# Patient Record
Sex: Female | Born: 1940 | Race: Black or African American | Hispanic: Yes | Marital: Single | Smoking: Never smoker
Health system: Southern US, Community
[De-identification: ages and names within clinical notes are randomized; demographics above are authoritative.]

## PROBLEM LIST (undated history)

## (undated) DIAGNOSIS — J302 Other seasonal allergic rhinitis: Secondary | ICD-10-CM

## (undated) DIAGNOSIS — I10 Essential (primary) hypertension: Secondary | ICD-10-CM

## (undated) DIAGNOSIS — K219 Gastro-esophageal reflux disease without esophagitis: Secondary | ICD-10-CM

## (undated) DIAGNOSIS — E785 Hyperlipidemia, unspecified: Secondary | ICD-10-CM

## (undated) HISTORY — PX: HYSTERECTOMY: SHX81

## (undated) HISTORY — PX: OTHER SURGICAL HISTORY: SHX169

## (undated) HISTORY — PX: DILATION AND CURETTAGE, DIAGNOSTIC / THERAPEUTIC: SUR384

## (undated) HISTORY — DX: Other seasonal allergic rhinitis: J30.2

## (undated) HISTORY — DX: Hyperlipidemia, unspecified: E78.5

## (undated) HISTORY — DX: Essential (primary) hypertension: I10

## (undated) HISTORY — DX: Gastro-esophageal reflux disease without esophagitis: K21.9

---

## 1986-03-12 ENCOUNTER — Inpatient Hospital Stay
Admission: RE | Admit: 1986-03-12 | Disposition: A | Discharge status: Home / Self Care | Payer: Self-pay | Source: Ambulatory Visit

## 1986-03-22 ENCOUNTER — Inpatient Hospital Stay
Admission: EM | Admit: 1986-03-22 | Disposition: A | Discharge status: Home / Self Care | Payer: Self-pay | Source: Ambulatory Visit

## 2002-01-27 ENCOUNTER — Ambulatory Visit
Admission: RE | Admit: 2002-01-27 | Disposition: A | Discharge status: Home / Self Care | Payer: Self-pay | Source: Ambulatory Visit

## 2003-09-27 ENCOUNTER — Ambulatory Visit
Admission: RE | Admit: 2003-09-27 | Disposition: A | Discharge status: Home / Self Care | Payer: Self-pay | Source: Ambulatory Visit

## 2009-06-01 DIAGNOSIS — J309 Allergic rhinitis, unspecified: Secondary | ICD-10-CM | POA: Insufficient documentation

## 2009-06-01 DIAGNOSIS — K219 Gastro-esophageal reflux disease without esophagitis: Secondary | ICD-10-CM | POA: Insufficient documentation

## 2015-10-25 ENCOUNTER — Other Ambulatory Visit
Admission: RE | Admit: 2015-10-25 | Discharge: 2015-10-25 | Disposition: A | Discharge status: Home / Self Care | Payer: Medicare Other | Source: Ambulatory Visit | Attending: Internal Medicine | Admitting: Internal Medicine

## 2015-10-25 LAB — CBC AND DIFFERENTIAL
Basophils %: 0.8 % (ref 0.0–3.0)
Basophils Absolute: 0 10*3/uL (ref 0.0–0.3)
Eosinophils %: 2.5 % (ref 0.0–7.0)
Eosinophils Absolute: 0.1 10*3/uL (ref 0.0–0.8)
Hematocrit: 37.3 % (ref 36.0–48.0)
Hemoglobin: 12.3 gm/dL (ref 12.0–16.0)
Lymphocytes Absolute: 1.6 10*3/uL (ref 0.6–5.1)
Lymphocytes: 27.3 % (ref 15.0–46.0)
MCH: 33 pg (ref 28–35)
MCHC: 33 gm/dL (ref 32–36)
MCV: 99 fL (ref 80–100)
MPV: 8.5 fL (ref 6.0–10.0)
Monocytes Absolute: 0.4 10*3/uL (ref 0.1–1.7)
Monocytes: 6.3 % (ref 3.0–15.0)
Neutrophils %: 63.1 % (ref 42.0–78.0)
Neutrophils Absolute: 3.7 10*3/uL (ref 1.7–8.6)
PLT CT: 221 10*3/uL (ref 130–440)
RBC: 3.78 10*6/uL — ABNORMAL LOW (ref 3.80–5.00)
RDW: 12.8 % (ref 11.0–14.0)
WBC: 5.8 10*3/uL (ref 4.0–11.0)

## 2015-10-25 LAB — VH URINALYSIS WITH MICROSCOPIC
Bilirubin, UA: NEGATIVE
Blood, UA: NEGATIVE
Glucose, UA: NEGATIVE mg/dL
Ketones UA: NEGATIVE mg/dL
Leukocyte Esterase, UA: NEGATIVE Leu/uL
Nitrite, UA: NEGATIVE
Protein, UR: NEGATIVE mg/dL
RBC, UA: <1 /hpf (ref 0–5)
Squam Epithel, UA: 29 /hpf — ABNORMAL HIGH (ref 0–2)
Urine Specific Gravity: 1.021 (ref 1.001–1.040)
Urobilinogen, UA: NORMAL mg/dL
WBC, UA: 1 /hpf (ref 0–4)
pH, Urine: 5 pH (ref 5.0–8.0)

## 2015-10-25 LAB — VITAMIN D,25 OH,TOTAL: Vitamin D 25-Hydroxy: 29 ng/mL — ABNORMAL LOW (ref 30–80)

## 2015-10-25 LAB — CREATININE, URINE, RANDOM: Urine Creatinine Random: 172.93 mg/dL

## 2015-10-25 LAB — FERRITIN: Ferritin: 62.63 ng/mL (ref 4.60–204.00)

## 2015-10-25 LAB — PROTEIN, URINE, RANDOM: Urine Protein: 9.6 mg/dL (ref 0.0–14.0)

## 2015-10-25 LAB — IRON PROFILE
% Saturation: 24 % (ref 15–50)
Iron: 67.8 ug/dL (ref 40.0–160.0)
TIBC: 288 ug/dL (ref 250–450)
Transferrin: 206 mg/dL (ref 173.0–360.0)

## 2015-10-25 LAB — RENAL FUNCTION PANEL
Albumin: 3.8 gm/dL (ref 3.5–5.0)
Anion Gap: 11.9 mMol/L (ref 7.0–18.0)
BUN / Creatinine Ratio: 18.4 Ratio (ref 10.0–30.0)
BUN: 26 mg/dL — ABNORMAL HIGH (ref 7–22)
CO2: 27.6 mMol/L (ref 20.0–30.0)
Calcium: 9.4 mg/dL (ref 8.5–10.5)
Chloride: 107 mMol/L (ref 98–110)
Creatinine: 1.41 mg/dL — ABNORMAL HIGH (ref 0.60–1.20)
EGFR: 44 mL/min/{1.73_m2}
Glucose: 125 mg/dL — ABNORMAL HIGH (ref 70–99)
Osmolality Calc: 291 mOsm/kg (ref 275–300)
Phosphorus: 3.6 mg/dL (ref 2.3–4.7)
Potassium: 3.5 mMol/L (ref 3.5–5.3)
Sodium: 143 mMol/L (ref 136–147)

## 2015-10-25 LAB — PTH, INTACT: PTH Intact: 74.1 pg/mL (ref 20.0–83.0)

## 2015-10-25 LAB — PHOSPHORUS: Phosphorus: 3.6 mg/dL (ref 2.3–4.7)

## 2015-10-25 LAB — URIC ACID: Uric acid: 6 mg/dL (ref 2.6–6.0)

## 2015-11-07 ENCOUNTER — Other Ambulatory Visit
Admission: RE | Admit: 2015-11-07 | Discharge: 2015-11-07 | Disposition: A | Discharge status: Home / Self Care | Payer: Medicare Other | Source: Ambulatory Visit | Attending: Family Medicine | Admitting: Family Medicine

## 2015-11-07 LAB — COMPREHENSIVE METABOLIC PANEL
ALT: 19 U/L (ref 0–55)
AST (SGOT): 19 U/L (ref 10–42)
Albumin/Globulin Ratio: 1.25 Ratio (ref 0.70–1.50)
Albumin: 4 gm/dL (ref 3.5–5.0)
Alkaline Phosphatase: 106 U/L (ref 40–145)
Anion Gap: 17.5 mMol/L (ref 7.0–18.0)
BUN / Creatinine Ratio: 17.9 Ratio (ref 10.0–30.0)
BUN: 26 mg/dL — ABNORMAL HIGH (ref 7–22)
Bilirubin, Total: 0.4 mg/dL (ref 0.1–1.2)
CO2: 26.6 mMol/L (ref 20.0–30.0)
Calcium: 10.1 mg/dL (ref 8.5–10.5)
Chloride: 103 mMol/L (ref 98–110)
Creatinine: 1.45 mg/dL — ABNORMAL HIGH (ref 0.60–1.20)
EGFR: 43 mL/min/{1.73_m2}
Globulin: 3.2 gm/dL (ref 2.0–4.0)
Glucose: 109 mg/dL — ABNORMAL HIGH (ref 70–99)
Osmolality Calc: 290 mOsm/kg (ref 275–300)
Potassium: 4.1 mMol/L (ref 3.5–5.3)
Protein, Total: 7.2 gm/dL (ref 6.0–8.3)
Sodium: 143 mMol/L (ref 136–147)

## 2015-11-07 LAB — LIPID PANEL
Cholesterol: 230 mg/dL — ABNORMAL HIGH (ref 75–199)
Coronary Heart Disease Risk: 2.84
HDL: 81 mg/dL — ABNORMAL HIGH (ref 45–65)
LDL Calculated: 130 mg/dL
Triglycerides: 95 mg/dL (ref 10–150)
VLDL: 19 (ref 0–40)

## 2016-04-07 ENCOUNTER — Other Ambulatory Visit (INDEPENDENT_AMBULATORY_CARE_PROVIDER_SITE_OTHER): Payer: Self-pay | Admitting: Family Medicine

## 2016-04-24 ENCOUNTER — Other Ambulatory Visit
Admission: RE | Admit: 2016-04-24 | Discharge: 2016-04-24 | Disposition: A | Discharge status: Home / Self Care | Payer: Medicare Other | Source: Ambulatory Visit | Attending: Family Medicine | Admitting: Family Medicine

## 2016-04-24 ENCOUNTER — Ambulatory Visit (INDEPENDENT_AMBULATORY_CARE_PROVIDER_SITE_OTHER): Payer: Medicare Other

## 2016-04-24 DIAGNOSIS — I10 Essential (primary) hypertension: Secondary | ICD-10-CM

## 2016-04-24 DIAGNOSIS — E7800 Pure hypercholesterolemia, unspecified: Secondary | ICD-10-CM

## 2016-04-24 LAB — LIPID PANEL
Cholesterol: 195 mg/dL (ref 75–199)
Coronary Heart Disease Risk: 2.87
HDL: 68 mg/dL — ABNORMAL HIGH (ref 45–65)
LDL Calculated: 113 mg/dL
Triglycerides: 69 mg/dL (ref 10–150)
VLDL: 14 (ref 0–40)

## 2016-04-24 LAB — CBC AND DIFFERENTIAL
Basophils %: 0.2 % (ref 0.0–3.0)
Basophils Absolute: 0 10*3/uL (ref 0.0–0.3)
Eosinophils %: 3.2 % (ref 0.0–7.0)
Eosinophils Absolute: 0.2 10*3/uL (ref 0.0–0.8)
Hematocrit: 37.4 % (ref 36.0–48.0)
Hemoglobin: 11.9 gm/dL — ABNORMAL LOW (ref 12.0–16.0)
Lymphocytes Absolute: 1.5 10*3/uL (ref 0.6–5.1)
Lymphocytes: 28.1 % (ref 15.0–46.0)
MCH: 31 pg (ref 28–35)
MCHC: 32 gm/dL (ref 32–36)
MCV: 98 fL (ref 80–100)
MPV: 8.5 fL (ref 6.0–10.0)
Monocytes Absolute: 0.4 10*3/uL (ref 0.1–1.7)
Monocytes: 7.1 % (ref 3.0–15.0)
Neutrophils %: 61.4 % (ref 42.0–78.0)
Neutrophils Absolute: 3.3 10*3/uL (ref 1.7–8.6)
PLT CT: 202 10*3/uL (ref 130–440)
RBC: 3.8 10*6/uL (ref 3.80–5.00)
RDW: 13.1 % (ref 11.0–14.0)
WBC: 5.4 10*3/uL (ref 4.0–11.0)

## 2016-04-24 LAB — COMPREHENSIVE METABOLIC PANEL
ALT: 19 U/L (ref 0–55)
AST (SGOT): 21 U/L (ref 10–42)
Albumin/Globulin Ratio: 1.12 Ratio (ref 0.70–1.50)
Albumin: 3.8 gm/dL (ref 3.5–5.0)
Alkaline Phosphatase: 104 U/L (ref 40–145)
Anion Gap: 13.4 mMol/L (ref 7.0–18.0)
BUN / Creatinine Ratio: 20.3 Ratio (ref 10.0–30.0)
BUN: 26 mg/dL — ABNORMAL HIGH (ref 7–22)
Bilirubin, Total: <0.3 mg/dL (ref 0.1–1.2)
CO2: 28.3 mMol/L (ref 20.0–30.0)
Calcium: 9.9 mg/dL (ref 8.5–10.5)
Chloride: 106 mMol/L (ref 98–110)
Creatinine: 1.28 mg/dL — ABNORMAL HIGH (ref 0.60–1.20)
EGFR: 47 mL/min/{1.73_m2} — ABNORMAL LOW (ref 60–150)
Globulin: 3.4 gm/dL (ref 2.0–4.0)
Glucose: 90 mg/dL (ref 70–99)
Osmolality Calc: 291 mOsm/kg (ref 275–300)
Potassium: 3.7 mMol/L (ref 3.5–5.3)
Protein, Total: 7.2 gm/dL (ref 6.0–8.3)
Sodium: 144 mMol/L (ref 136–147)

## 2016-04-24 NOTE — Progress Notes (Signed)
Date Specimen Drawn:  04/24/2016   Time Specimen Drawn:  10:07 AM   Test(s) Ordered:  CBC, CMP, LIPID   Disposition:  n/a   Patient's Tolerance:  Good   Location Specimen Drawn:  Right antecubital

## 2016-05-03 ENCOUNTER — Ambulatory Visit (INDEPENDENT_AMBULATORY_CARE_PROVIDER_SITE_OTHER): Payer: Medicare Other | Admitting: Family Medicine

## 2016-05-03 VITALS — BP 120/70 | HR 82 | Temp 98.6°F | Resp 16 | Ht 67.0 in | Wt 180.0 lb

## 2016-05-03 DIAGNOSIS — E7800 Pure hypercholesterolemia, unspecified: Secondary | ICD-10-CM

## 2016-05-03 DIAGNOSIS — I10 Essential (primary) hypertension: Secondary | ICD-10-CM | POA: Insufficient documentation

## 2016-05-03 MED ORDER — LOSARTAN POTASSIUM 100 MG PO TABS
100.0000 mg | ORAL_TABLET | Freq: Every day | ORAL | Status: DC
Start: 2016-05-03 — End: 2016-11-02

## 2016-05-03 NOTE — Procedures (Signed)
Date Time: 05/03/2016 12:29 PM  Patient Name: Kelli Jordan, Kelli Jordan  Primary Care Physician: Alfonse Alpers, MD      History of Presenting Illness:   Kelli Jordan is here today for Blood work results and Cholesterol  Check.  Kelli Jordan cannot recall when the diagnosis was first made. The patient does not have a diet or regular exercise regimen. The patient has been compliant with medication intake. Kelli Jordan denies any hyper cholesterol related symptoms or side effects from medication. Recent blood work shows TC=195, LDL=113, HDL=68, Trig=69.    With regards to her HTN, she is doing fairly well, and denies any side effects from her medication.      Review of Systems   Constitutional: Positive for malaise/fatigue.   All other systems reviewed and are negative.         Past Medical History:   No past medical history on file.    Past Surgical History:   No past surgical history on file.    Family History:   No family history on file.    Social History:     Social History     Social History   . Marital Status: Single     Spouse Name: N/A   . Number of Children: N/A   . Years of Education: N/A     Occupational History   . Not on file.     Social History Main Topics   . Smoking status: Not on file   . Smokeless tobacco: Not on file   . Alcohol Use: Not on file   . Drug Use: Not on file   . Sexual Activity: Not on file     Other Topics Concern   . Not on file     Social History Narrative   . No narrative on file     History   Smoking status   . Not on file   Smokeless tobacco   . Not on file     History   Alcohol Use: Not on file     History   Drug Use Not on file       Allergies:     Allergies   Allergen Reactions   . Aspirin Rash     Rash with higher doses, denies symptoms with Aspirin 81 mg, managed by PCP per patient   . Iodine Rash     Contrast iodine only       Medications:     Prior to Admission medications    Medication Sig Start Date End Date Taking? Authorizing Provider   amLODIPine (NORVASC) 10 MG  tablet TAKE 1 TAB BY MOUTH DAILY 04/11/16  Yes Nyzier Boivin, Clotilde Dieter, MD   aspirin 81 MG tablet    Yes [provider]   atorvastatin (LIPITOR) 40 MG tablet    Yes [provider]   Diclofenac Sodium 1 % Cream Place 1 % onto the skin 2 (two) times daily as needed.   Yes [provider]   fluticasone Aleda Grana) 50 MCG/ACT nasal spray  07/12/15  Yes [provider]   labetalol (NORMODYNE) 200 MG tablet TAKE 1 TAB BY MOUTH TWICE DAILY 03/09/16  Yes [provider]   loratadine (CLARITIN) 10 MG tablet    Yes [provider]   losartan (COZAAR) 100 MG tablet    Yes [provider]   Prenatal Vit-Fe Fumarate-FA (PRENATAL VITAMIN) 27-0.8 MG Tab    Yes [provider]   raNITIdine (  ZANTAC) 300 MG tablet TAKE 1 TABLET BY MOUTH IN THE EVENING ANY FURTHER REFILL REQUESTS CAN BE DIRECTED TO PCP 03/12/16  Yes [provider]   Multiple Minerals-Vitamins (CALCIUM & VIT D3 BONE HEALTH PO)   05/03/16 Yes [provider]       Filed Vitals:    05/03/16 1157   BP: 120/70   Pulse: 82   Temp: 98.6 F (37 C)   Resp: 16       Physical Exam   Constitutional: She appears well-developed and well-nourished. No distress.   Obese   HENT:   Head: Normocephalic and atraumatic.   Right Ear: External ear normal.   Left Ear: External ear normal.   Nose: Nose normal.   Mouth/Throat: Oropharynx is clear and moist.   BIlateral TMs intact.   Eyes: Conjunctivae and EOM are normal. Pupils are equal, round, and reactive to light. Right eye exhibits no discharge. Left eye exhibits no discharge. No scleral icterus.   Neck: Normal range of motion. Neck supple. No JVD present. No tracheal deviation present. No thyromegaly present.   Bilateral carotids, no bruits   Cardiovascular: Normal rate, regular rhythm, normal heart sounds and intact distal pulses.    No murmur heard.  PMI normal.   Pulmonary/Chest: Effort normal and breath sounds normal. No respiratory distress. She has no  wheezes. She has no rales. She exhibits no tenderness.   Abdominal: Soft. Bowel sounds are normal. She exhibits no distension and no mass. There is no tenderness. There is no guarding.   Lymphadenopathy:     She has no cervical adenopathy.   Skin: She is not diaphoretic.   Vitals reviewed.            Assessment:     1. Essential hypertension     2. Hypercholesteremia            Plan:      No change in medication.   Counseling with regards to low salt, low fat diet; increase physical activity with a goal to lose weight.           Signed by: Alfonse Alpers, MD

## 2016-07-14 ENCOUNTER — Other Ambulatory Visit (INDEPENDENT_AMBULATORY_CARE_PROVIDER_SITE_OTHER): Payer: Self-pay | Admitting: Family Medicine

## 2016-07-27 ENCOUNTER — Ambulatory Visit (INDEPENDENT_AMBULATORY_CARE_PROVIDER_SITE_OTHER): Payer: Medicare Other

## 2016-07-27 ENCOUNTER — Other Ambulatory Visit (INDEPENDENT_AMBULATORY_CARE_PROVIDER_SITE_OTHER): Payer: Self-pay | Admitting: Family Medicine

## 2016-07-27 DIAGNOSIS — Z23 Encounter for immunization: Secondary | ICD-10-CM

## 2016-07-30 ENCOUNTER — Other Ambulatory Visit (INDEPENDENT_AMBULATORY_CARE_PROVIDER_SITE_OTHER): Payer: Self-pay | Admitting: Family Medicine

## 2016-08-24 ENCOUNTER — Encounter (INDEPENDENT_AMBULATORY_CARE_PROVIDER_SITE_OTHER): Payer: Self-pay

## 2016-08-24 NOTE — Progress Notes (Signed)
Kelli Jordan comes in to have BP checked. BP=138/80. Dr Junius Roads informed. No action needed.  pmartin lpn

## 2016-09-29 ENCOUNTER — Other Ambulatory Visit (INDEPENDENT_AMBULATORY_CARE_PROVIDER_SITE_OTHER): Payer: Self-pay | Admitting: Family Medicine

## 2016-10-14 ENCOUNTER — Other Ambulatory Visit (INDEPENDENT_AMBULATORY_CARE_PROVIDER_SITE_OTHER): Payer: Self-pay | Admitting: Family Medicine

## 2016-10-29 ENCOUNTER — Ambulatory Visit (INDEPENDENT_AMBULATORY_CARE_PROVIDER_SITE_OTHER): Payer: Medicare Other

## 2016-10-29 ENCOUNTER — Other Ambulatory Visit
Admission: RE | Admit: 2016-10-29 | Discharge: 2016-10-29 | Disposition: A | Discharge status: Home / Self Care | Payer: Medicare Other | Source: Ambulatory Visit | Attending: Family Medicine | Admitting: Family Medicine

## 2016-10-29 ENCOUNTER — Other Ambulatory Visit (INDEPENDENT_AMBULATORY_CARE_PROVIDER_SITE_OTHER): Payer: Self-pay | Admitting: Family Medicine

## 2016-10-29 DIAGNOSIS — I10 Essential (primary) hypertension: Secondary | ICD-10-CM

## 2016-10-29 DIAGNOSIS — E7800 Pure hypercholesterolemia, unspecified: Secondary | ICD-10-CM

## 2016-10-29 DIAGNOSIS — Z79899 Other long term (current) drug therapy: Secondary | ICD-10-CM

## 2016-10-29 LAB — COMPREHENSIVE METABOLIC PANEL
ALT: 17 U/L (ref 0–55)
AST (SGOT): 17 U/L (ref 10–42)
Albumin/Globulin Ratio: 1.12 Ratio (ref 0.70–1.50)
Albumin: 3.7 gm/dL (ref 3.5–5.0)
Alkaline Phosphatase: 120 U/L (ref 40–145)
Anion Gap: 13.8 mMol/L (ref 7.0–18.0)
BUN / Creatinine Ratio: 15.8 Ratio (ref 10.0–30.0)
BUN: 15 mg/dL (ref 7–22)
Bilirubin, Total: 0.4 mg/dL (ref 0.1–1.2)
CO2: 24.2 mMol/L (ref 20.0–30.0)
Calcium: 9.6 mg/dL (ref 8.5–10.5)
Chloride: 109 mMol/L (ref 98–110)
Creatinine: 0.95 mg/dL (ref 0.60–1.20)
EGFR: 68 mL/min/{1.73_m2} (ref 60–150)
Globulin: 3.3 gm/dL (ref 2.0–4.0)
Glucose: 103 mg/dL — ABNORMAL HIGH (ref 70–99)
Osmolality Calc: 286 mOsm/kg (ref 275–300)
Potassium: 4 mMol/L (ref 3.5–5.3)
Protein, Total: 7 gm/dL (ref 6.0–8.3)
Sodium: 143 mMol/L (ref 136–147)

## 2016-10-29 LAB — LIPID PANEL
Cholesterol: 182 mg/dL (ref 75–199)
Coronary Heart Disease Risk: 2.53
HDL: 72 mg/dL — ABNORMAL HIGH (ref 45–65)
LDL Calculated: 95 mg/dL
Triglycerides: 76 mg/dL (ref 10–150)
VLDL: 15 (ref 0–40)

## 2016-10-29 NOTE — Progress Notes (Signed)
Date Specimen Drawn:  10/29/2016   Time Specimen Drawn:  9:19 AM   Test(s) Ordered:  CMP,LIPID   Disposition:  n/a   Patient's Tolerance:  Good   Location Specimen Drawn:  Right antecubital

## 2016-11-01 ENCOUNTER — Other Ambulatory Visit (INDEPENDENT_AMBULATORY_CARE_PROVIDER_SITE_OTHER): Payer: Self-pay | Admitting: Family Medicine

## 2016-11-01 ENCOUNTER — Ambulatory Visit (INDEPENDENT_AMBULATORY_CARE_PROVIDER_SITE_OTHER): Payer: Medicare Other | Admitting: Family Medicine

## 2016-11-01 ENCOUNTER — Encounter (INDEPENDENT_AMBULATORY_CARE_PROVIDER_SITE_OTHER): Payer: Self-pay | Admitting: Family Medicine

## 2016-11-01 VITALS — BP 138/69 | HR 78 | Temp 98.6°F | Resp 16 | Ht 67.0 in | Wt 184.0 lb

## 2016-11-01 DIAGNOSIS — Z0000 Encounter for general adult medical examination without abnormal findings: Secondary | ICD-10-CM

## 2016-11-01 DIAGNOSIS — E7800 Pure hypercholesterolemia, unspecified: Secondary | ICD-10-CM

## 2016-11-01 DIAGNOSIS — Z79899 Other long term (current) drug therapy: Secondary | ICD-10-CM

## 2016-11-01 MED ORDER — ATORVASTATIN CALCIUM 40 MG PO TABS
40.0000 mg | ORAL_TABLET | Freq: Every day | ORAL | 1 refills | Status: DC
Start: 2016-11-01 — End: 2017-05-15

## 2016-11-01 NOTE — Progress Notes (Signed)
Kelli Jordan is a 76 y.o. female who presents today for a Medicare Annual Wellness Visit.     Health Risk Assessment     During the past month, how would you rate your general health?:   good  Which of the following tasks can you do without assistance - drive or take the bus alone; shop for groceries or clothes; prepare your own meals; do your own housework/laundry; handle your own finances/pay bills; eat, bathe or get around your home?:   all  Which of the following problems have you been bothered by in the past month - dizzy when standing up; problems using the phone; feeling tired or fatigued; moderate or severe body pain?:  no  Do you exercise for about 20 minutes 3 or more days per week?:   no  During the past month was someone available to help if you needed and wanted help?  For example, if you felt nervous, lonely, got sick and had to stay in bed, needed someone to talk to, needed help with daily chores or needed help just taking care of yourself.:  no  Do you always wear a seat belt?:  yes  Do you have any trouble taking medications the way you have been told to take them?:  no  Have you been given any information that can help you with keeping track of your medications?:  yes  Do you have trouble paying for your medications?:    Have you been given any information that can help you with hazards in your house, such as scatter rugs, furniture, etc?:  yes  Do you feel unsteady when standing or walking?:  no  Do you worry about falling?:  no  Have you fallen two or more times in the past year?:  no  Did you suffer any injuries from your falls in the past year?:      Additional Concerns    Patient Care Team:  Alfonse Alpers, MD as PCP - General (Family Medicine)    Past Medical History:   Diagnosis Date   . Hyperlipidemia    . Hypertension    . Seasonal allergic rhinitis      Past Surgical History:   Procedure Laterality Date   . DILATION AND CURETTAGE, DIAGNOSTIC / THERAPEUTIC     . HYSTERECTOMY        Allergies   Allergen Reactions   . Aspirin Rash     Rash with higher doses, denies symptoms with Aspirin 81 mg, managed by PCP per patient   . Iodine Rash     Contrast iodine only      Current Outpatient Prescriptions   Medication Sig Dispense Refill   . amLODIPine (NORVASC) 10 MG tablet TAKE 1 TAB BY MOUTH DAILY 90 tablet 1   . aspirin 81 MG tablet      . atorvastatin (LIPITOR) 40 MG tablet TAKE 1 TABLET DAILY 90 tablet 1   . Diclofenac Sodium 1 % Cream Place 1 % onto the skin 2 (two) times daily as needed.     . fluticasone (FLONASE) 50 MCG/ACT nasal spray      . labetalol (NORMODYNE) 200 MG tablet TAKE 1 TAB BY MOUTH TWICE DAILY 180 tablet 3   . loratadine (CLARITIN) 10 MG tablet      . losartan (COZAAR) 100 MG tablet Take 1 tablet (100 mg total) by mouth daily. 90 tablet 1   . prednisoLONE acetate (PRED FORTE) 1 % ophthalmic suspension USE 1 DROP  INTO BOTH EYES FOUR TIMES A DAY  1   . Prenatal Vit-Fe Fumarate-FA (PRENATAL VITAMIN) 27-0.8 MG Tab      . raNITIdine (ZANTAC) 300 MG tablet TAKE 1 TABLET BY MOUTH IN THE EVENING ANY FURTHER REFILL REQUESTS CAN BE DIRECTED TO PCP  0     No current facility-administered medications for this visit.       Social History   Substance Use Topics   . Smoking status: Never Smoker   . Smokeless tobacco: Never Used   . Alcohol use No      Family History   Problem Relation Age of Onset   . Diabetes Mother    . COPD Mother    . Dementia Father         Hospitalizations  no hospitalizations within past year    Depression Screening    See related Activity or Flowsheet    Functional Ability    Falls Risk:  home does not have throw rugs, poor lighting or a slippery bath tub or shower  Hearing:  hearing within normal limits  Exercise:  Light ( i.e. stretching or slow walking )  ADL's:   Bathing - independent   Dressing - independent   Mobility - independent   Transfer - independent   Eating - independent}   Toileting - independent   ADL assistance provided by not needed    Discussion  of Advance Directives: Has an Advanced Directive. A copy has not been provided. Requested to provide.     Assessment    BP 138/69 (BP Site: Left arm, Patient Position: Sitting, Cuff Size: Large)   Pulse 78   Temp 98.6 F (37 C)   Resp 16   Ht 1.702 m (5\' 7" )   Wt 83.5 kg (184 lb)   BMI 28.82 kg/m      Vision Screening (required for IPPE only): Not performed  Screening EKG (IPPE only): not indicated    Evaluation of Cognitive Function    Mood/affect: Appropriate  Appearance: neatly groomed, appropriately and adequately nourished  Family member/caregiver input: Not present    Mini-Cog    Step 1: Three word registration  Version 1: Banana; Sunrise; Chair  Version 2: Leader; Season; Table    Step 2: Clock drawing    Step 3: Three word recall    Scoring:  - word recall: 0-3 points (1 point for each)  - clock draw: 0 or 2 points (normal clock = 2 points)  Total: 0-5 points (< 4 points may indicate need for further evaluation)    Result:  > 3 points - negative screen for dementia    Counseling and Referral of Preventive Services    Bone Mass Measurements    Assessment/Plan    1. Medicare annual wellness visit, subsequent          Alfonse Alpers, MD

## 2016-11-02 ENCOUNTER — Other Ambulatory Visit (INDEPENDENT_AMBULATORY_CARE_PROVIDER_SITE_OTHER): Payer: Self-pay | Admitting: Family Medicine

## 2017-01-29 ENCOUNTER — Other Ambulatory Visit
Admission: RE | Admit: 2017-01-29 | Discharge: 2017-01-29 | Disposition: A | Discharge status: Home / Self Care | Payer: Medicare Other | Source: Ambulatory Visit | Attending: Family Medicine | Admitting: Family Medicine

## 2017-01-29 ENCOUNTER — Ambulatory Visit (INDEPENDENT_AMBULATORY_CARE_PROVIDER_SITE_OTHER): Payer: Medicare Other | Admitting: Family Medicine

## 2017-01-29 DIAGNOSIS — Z79899 Other long term (current) drug therapy: Secondary | ICD-10-CM

## 2017-01-29 DIAGNOSIS — E7800 Pure hypercholesterolemia, unspecified: Secondary | ICD-10-CM

## 2017-01-29 LAB — COMPREHENSIVE METABOLIC PANEL
ALT: 17 U/L (ref 0–55)
AST (SGOT): 18 U/L (ref 10–42)
Albumin/Globulin Ratio: 1.32 Ratio (ref 0.70–1.50)
Albumin: 4.1 gm/dL (ref 3.5–5.0)
Alkaline Phosphatase: 123 U/L (ref 40–145)
Anion Gap: 15.4 mMol/L (ref 7.0–18.0)
BUN / Creatinine Ratio: 16.3 Ratio (ref 10.0–30.0)
BUN: 23 mg/dL — ABNORMAL HIGH (ref 7–22)
Bilirubin, Total: 0.4 mg/dL (ref 0.1–1.2)
CO2: 25.4 mMol/L (ref 20.0–30.0)
Calcium: 9.5 mg/dL (ref 8.5–10.5)
Chloride: 107 mMol/L (ref 98–110)
Creatinine: 1.41 mg/dL — ABNORMAL HIGH (ref 0.60–1.20)
EGFR: 42 mL/min/{1.73_m2} — ABNORMAL LOW (ref 60–150)
Globulin: 3.1 gm/dL (ref 2.0–4.0)
Glucose: 107 mg/dL — ABNORMAL HIGH (ref 71–99)
Osmolality Calc: 291 mOsm/kg (ref 275–300)
Potassium: 3.8 mMol/L (ref 3.5–5.3)
Protein, Total: 7.2 gm/dL (ref 6.0–8.3)
Sodium: 144 mMol/L (ref 136–147)

## 2017-01-29 LAB — LIPID PANEL
Cholesterol: 174 mg/dL (ref 75–199)
Coronary Heart Disease Risk: 2.32
HDL: 75 mg/dL — ABNORMAL HIGH (ref 45–65)
LDL Calculated: 80 mg/dL
Triglycerides: 96 mg/dL (ref 10–150)
VLDL: 19 (ref 0–40)

## 2017-01-30 ENCOUNTER — Encounter (INDEPENDENT_AMBULATORY_CARE_PROVIDER_SITE_OTHER): Payer: Self-pay | Admitting: Family Medicine

## 2017-01-30 ENCOUNTER — Ambulatory Visit (INDEPENDENT_AMBULATORY_CARE_PROVIDER_SITE_OTHER): Payer: Medicare Other | Admitting: Family Medicine

## 2017-01-30 VITALS — BP 123/66 | HR 75 | Temp 98.7°F | Resp 16 | Ht 67.0 in | Wt 183.8 lb

## 2017-01-30 DIAGNOSIS — M25512 Pain in left shoulder: Secondary | ICD-10-CM

## 2017-01-30 DIAGNOSIS — E7800 Pure hypercholesterolemia, unspecified: Secondary | ICD-10-CM

## 2017-01-30 DIAGNOSIS — K219 Gastro-esophageal reflux disease without esophagitis: Secondary | ICD-10-CM

## 2017-01-30 NOTE — Progress Notes (Signed)
Date Time: 01/30/2017 6:14 PM  Patient Name: Kelli Jordan, Kelli Jordan  Primary Care Physician: Alfonse Alpers, MD      History of Presenting Illness:   JEANIA NATER is here today for Blood work results and Cholesterol  Check.  [name] cannot recall when the diagnosis was first made. The patient does not have a diet or regular exercise regimen. The patient has been compliant with medication intake. [name] denies any hyper cholesterol related symptoms or side effects from medication. Recent blood work shows   Lab Results   Component Value Date    CHOL 174 01/29/2017    HDL 75 (H) 01/29/2017    LDL 80 01/29/2017    TRIG 96 01/29/2017     Suleima mentions having recurrent heartburn that she points to her anterior substernal area. She usually takes Zantac 300 mg once a day which sometimes helps.   She had a study done in the past which showed some gallstones but she denies really of any abdominal pain, nausea or vomiting.    Khaleah also complains of left shoulder stiffness as well as decreased range of motion  and pain on and off. She denies any significant trauma or injury. She claims that she has had this for about 3 months now on and off ;she is unsure of what to do. She denies radiation of pain to her neck or any tingling or numbness to her left arm. She does not really take anything for this as she is careful about her kidney function.    Review of Systems   Musculoskeletal: Positive for joint pain.   All other systems reviewed and are negative.         Past Medical History:     Past Medical History:   Diagnosis Date   . Gastroesophageal reflux disease    . Hyperlipidemia    . Hypertension    . Seasonal allergic rhinitis        Past Surgical History:     Past Surgical History:   Procedure Laterality Date   . DILATION AND CURETTAGE, DIAGNOSTIC / THERAPEUTIC     . HYSTERECTOMY         Family History:     Family History   Problem Relation Age of Onset   . Diabetes Mother    . COPD Mother    . Dementia Father         Social History:     Social History     Social History   . Marital status: Single     Spouse name: N/A   . Number of children: N/A   . Years of education: N/A     Occupational History   . Not on file.     Social History Main Topics   . Smoking status: Never Smoker   . Smokeless tobacco: Never Used   . Alcohol use No   . Drug use: No   . Sexual activity: No     Other Topics Concern   . Not on file     Social History Narrative   . No narrative on file     History   Smoking Status   . Never Smoker   Smokeless Tobacco   . Never Used     History   Alcohol Use No     History   Drug Use No       Allergies:     Allergies   Allergen Reactions   . Aspirin  Rash     Rash with higher doses, denies symptoms with Aspirin 81 mg, managed by PCP per patient   . Iodine Rash     Contrast iodine only       Medications:     Prior to Admission medications    Medication Sig Start Date End Date Taking? Authorizing Provider   amLODIPine (NORVASC) 10 MG tablet TAKE 1 TAB BY MOUTH DAILY 10/15/16  Yes Esvin Hnat, Clotilde Dieter, MD   aspirin 81 MG tablet    Yes [provider]   atorvastatin (LIPITOR) 40 MG tablet Take 1 tablet (40 mg total) by mouth daily. 11/01/16  Yes Halsey Hammen, Clotilde Dieter, MD   Diclofenac Sodium 1 % Cream Place 1 % onto the skin 2 (two) times daily as needed.   Yes [provider]   fluticasone (FLONASE) 50 MCG/ACT nasal spray  07/12/15  Yes [provider]   labetalol (NORMODYNE) 200 MG tablet TAKE 1 TAB BY MOUTH TWICE DAILY 10/01/16  Yes Broc Caspers, Clotilde Dieter, MD   loratadine (CLARITIN) 10 MG tablet    Yes [provider]   losartan (COZAAR) 100 MG tablet TAKE 1 TABLET (100 MG TOTAL) BY MOUTH DAILY. 11/02/16  Yes Randa Spike B, MD   prednisoLONE acetate (PRED FORTE) 1 % ophthalmic suspension USE 1 DROP INTO RIGHT EYE BID 08/23/16  Yes [provider]   Prenatal Vit-Fe Fumarate-FA (PRENATAL VITAMIN) 27-0.8 MG Tab    Yes [provider]   raNITIdine (ZANTAC) 300 MG tablet TAKE 1  TABLET BY MOUTH IN THE EVENING ANY FURTHER REFILL REQUESTS CAN BE DIRECTED TO PCP 03/12/16  Yes [provider]       Vitals:    01/30/17 1407   BP: 123/66   Pulse: 75   Resp: 16   Temp: 98.7 F (37.1 C)       Physical Exam   Constitutional: She is oriented to person, place, and time. She appears well-developed and well-nourished. No distress.   Obese   HENT:   Head: Normocephalic and atraumatic.   Right Ear: External ear normal.   Left Ear: External ear normal.   Nose: Nose normal.   Mouth/Throat: Oropharynx is clear and moist.   BIlateral TMs intact.   Eyes: Conjunctivae and EOM are normal. Pupils are equal, round, and reactive to light. Right eye exhibits no discharge. Left eye exhibits no discharge. No scleral icterus.   Neck: Normal range of motion. Neck supple. No JVD present. No tracheal deviation present. No thyromegaly present.   Bilateral carotids, no bruits   Cardiovascular: Normal rate, regular rhythm, normal heart sounds and intact distal pulses.    No murmur heard.  PMI normal.   Pulmonary/Chest: Effort normal and breath sounds normal. No respiratory distress. She has no wheezes. She has no rales. She exhibits no tenderness.   Abdominal: Soft. Bowel sounds are normal. She exhibits no distension and no mass. There is no tenderness. There is no guarding.   Musculoskeletal: She exhibits no edema or deformity.   Left shoulder with decreased range of motion   Lymphadenopathy:     She has no cervical adenopathy.   Neurological: She is alert and oriented to person, place, and time.   Skin: She is not diaphoretic.   Psychiatric: She has a normal mood and affect. Her behavior is normal. Judgment and thought content normal.   Vitals reviewed.            Assessment:     1. Hypercholesteremia  2. GERD without esophagitis     3. Acute pain of left shoulder            Plan:      No change in medication.   Counseling with regards to low salt, low fat diet, increase physical activity with a goal to lose  weight. Lifestyle modification in general.  Discussed importance of primary prevention of CAD with aggressive treatment of high cholesterol, as well as benefits of taking daily low-dose aspirin.  Reminded patient about her gallbladder ultrasound in the past showing some gallstones. If she continues to have worsening of her heartburn or develops any abdominal pain and then most likely we may need to have another gallbladder ultrasound and surgical referral .At this point she doesn't really want any referral.  Patient was advised to have physical therapy for her shoulder, as it may cause some difficulties later on such as a frozen shoulder. She was also recommended to go see her orthopedic doctor Dr. Kristeen Mans if worse comes to worse to get a cortisone shot to the joint.  Patient was advised to be off her hydrochlorothiazide as her kidney function on has gotten worse compared to last time.          Signed by: Alfonse Alpers, MD

## 2017-03-01 ENCOUNTER — Ambulatory Visit (INDEPENDENT_AMBULATORY_CARE_PROVIDER_SITE_OTHER): Payer: Medicare Other | Admitting: Family Medicine

## 2017-03-01 ENCOUNTER — Encounter (INDEPENDENT_AMBULATORY_CARE_PROVIDER_SITE_OTHER): Payer: Self-pay | Admitting: Family Medicine

## 2017-03-01 VITALS — BP 119/64 | HR 74 | Temp 98.5°F | Resp 16 | Ht 67.0 in | Wt 182.2 lb

## 2017-03-01 DIAGNOSIS — T7840XA Allergy, unspecified, initial encounter: Secondary | ICD-10-CM

## 2017-03-01 NOTE — Progress Notes (Signed)
Subjective:    Patient ID: Kelli Jordan is a 76 y.o. female.    HPI      Kelli Jordan comes in because of worsening left eye swelling that she noted last night. This morning, her left eye was almost shut close, but is much better now. Her left side of neck, face and upper chest are red and itchy. She denies any obvious bug bit. No SOB.    The following portions of the patient's history were reviewed and updated as appropriate: allergies, current medications, past family history, past medical history, past social history, past surgical history and problem list.    Review of Systems   HENT:        Left periorbital swelling   Skin: Positive for rash.   Allergic/Immunologic: Positive for environmental allergies.           Objective:    Physical Exam   Constitutional: She appears well-developed and well-nourished. No distress.   HENT:   Head: Normocephalic.   Mouth/Throat: Oropharynx is clear and moist.   Eyes: Conjunctivae and EOM are normal. Pupils are equal, round, and reactive to light.   Some swelling of the upper left eyelid   Neck: Normal range of motion. Neck supple. No JVD present. No thyromegaly present.   Cardiovascular: Normal rate, regular rhythm, normal heart sounds and intact distal pulses.    Pulmonary/Chest: Effort normal and breath sounds normal.   Lymphadenopathy:     She has no cervical adenopathy.   Skin: Skin is warm. Rash noted. There is erythema.   Some erythema around the upper and chest area   Vitals reviewed.          Assessment:       1. Allergic reaction, initial encounter          Plan:       Discussed with patient that this may just be an allergic reaction  to some environmental factors. She was advised to take loratadine 10 mg twice a day.

## 2017-03-04 ENCOUNTER — Encounter (INDEPENDENT_AMBULATORY_CARE_PROVIDER_SITE_OTHER): Payer: Self-pay | Admitting: Family Medicine

## 2017-03-04 ENCOUNTER — Ambulatory Visit (INDEPENDENT_AMBULATORY_CARE_PROVIDER_SITE_OTHER): Payer: Medicare Other | Admitting: Family Medicine

## 2017-03-04 ENCOUNTER — Other Ambulatory Visit (INDEPENDENT_AMBULATORY_CARE_PROVIDER_SITE_OTHER): Payer: Self-pay | Admitting: Family Medicine

## 2017-03-04 VITALS — BP 119/58 | HR 72 | Temp 99.1°F | Resp 14 | Ht 67.0 in | Wt 183.0 lb

## 2017-03-04 DIAGNOSIS — Z01818 Encounter for other preprocedural examination: Secondary | ICD-10-CM

## 2017-03-04 DIAGNOSIS — Z9109 Other allergy status, other than to drugs and biological substances: Secondary | ICD-10-CM

## 2017-03-04 MED ORDER — PREDNISONE 20 MG PO TABS
20.0000 mg | ORAL_TABLET | Freq: Every day | ORAL | 0 refills | Status: DC
Start: 2017-03-04 — End: 2017-04-19

## 2017-03-04 NOTE — Progress Notes (Signed)
Subjective:    Patient ID: Kelli Jordan is a 76 y.o. female.    HPI        Kelli Jordan continues to have some swelling in her left face, but definitely improved from the last time I saw her. Her chest is not as red anymore but she admits to significant itching. She continues to take the Claritin twice a day. She mentions about doing some gardening although it was only for 10 minutes the most, then she might have been exposed to some contact dermatitis.    The following portions of the patient's history were reviewed and updated as appropriate: allergies, current medications, past family history, past medical history, past social history, past surgical history and problem list.    Review of Systems   HENT: Positive for facial swelling.    Skin:        Itching in her chest and behind her left ear   Allergic/Immunologic: Positive for environmental allergies.   All other systems reviewed and are negative.          Objective:    Physical Exam   Constitutional: She appears well-developed and well-nourished. No distress.   HENT:   Head: Normocephalic.   Right Ear: External ear normal.   Left Ear: External ear normal.   Nose: Nose normal.   Mouth/Throat: Oropharynx is clear and moist.   Eyes: Conjunctivae and EOM are normal. Pupils are equal, round, and reactive to light.   Slight left periorbital swelling, but definitely much improved from the last time I saw her   Neck: Normal range of motion. No tracheal deviation present.   Skin:   Erythema in her chest from last week has resolved   Vitals reviewed.          Assessment:       1. Environmental allergies          Plan:       Prednisone x 2 weeks

## 2017-03-13 ENCOUNTER — Encounter (INDEPENDENT_AMBULATORY_CARE_PROVIDER_SITE_OTHER): Payer: Self-pay | Admitting: Family Medicine

## 2017-03-13 ENCOUNTER — Ambulatory Visit (INDEPENDENT_AMBULATORY_CARE_PROVIDER_SITE_OTHER): Payer: Medicare Other | Admitting: Family Medicine

## 2017-03-13 VITALS — BP 155/83 | HR 79 | Temp 98.6°F | Resp 16 | Ht 67.0 in | Wt 181.2 lb

## 2017-03-13 DIAGNOSIS — H02401 Unspecified ptosis of right eyelid: Secondary | ICD-10-CM

## 2017-03-13 DIAGNOSIS — Z01818 Encounter for other preprocedural examination: Secondary | ICD-10-CM

## 2017-03-13 NOTE — Progress Notes (Signed)
Subjective:    Patient ID: Kelli Jordan is a 76 y.o. female.    HPI       Kelli Jordan comes in at the request of Dr. Gean Quint prior to her right blepharoplasty secondary "Ptosis" of the right upper lid, on July 3rd.    The following portions of the patient's history were reviewed and updated as appropriate: allergies, current medications, past family history, past medical history, past social history, past surgical history and problem list.    Review of Systems   All other systems reviewed and are negative.          Objective:    Physical Exam   Constitutional: She is oriented to person, place, and time. She appears well-developed and well-nourished. No distress.   Marland Kitchen Appropriately groomed and in no apparent distress.   HENT:   Head: Normocephalic and atraumatic.   Right Ear: External ear normal.   Left Ear: External ear normal.   Nose: Nose normal.   Mouth/Throat: Oropharynx is clear and moist.   Eyes: Conjunctivae and EOM are normal. Pupils are equal, round, and reactive to light. No scleral icterus.   Neck: Normal range of motion. Neck supple. No JVD present. No thyromegaly present.   No carotid bruits noted.   Cardiovascular: Normal rate, regular rhythm, normal heart sounds and intact distal pulses.    No murmur heard.  Normal PMI   Pulmonary/Chest: Effort normal and breath sounds normal. She has no wheezes. She exhibits no tenderness.   Symmetric expansion, no dyspnea.   Abdominal: Soft. Bowel sounds are normal. She exhibits no distension and no mass. There is no tenderness. There is no rebound.   No bruits noted.   Musculoskeletal: Normal range of motion. She exhibits no edema or tenderness.   Lymphadenopathy:     She has no cervical adenopathy.   Neurological: She is alert and oriented to person, place, and time. No cranial nerve deficit. Coordination normal.   Normal gait           Assessment:       1. Preop testing    2. Ptosis of eyelid, right          Plan:       CP wise cleared for right eye surgery  EKG  reviewed - Normal

## 2017-04-06 ENCOUNTER — Other Ambulatory Visit (INDEPENDENT_AMBULATORY_CARE_PROVIDER_SITE_OTHER): Payer: Self-pay | Admitting: Family Medicine

## 2017-04-19 ENCOUNTER — Ambulatory Visit (INDEPENDENT_AMBULATORY_CARE_PROVIDER_SITE_OTHER): Payer: Medicare Other | Admitting: Family Medicine

## 2017-04-19 ENCOUNTER — Encounter (INDEPENDENT_AMBULATORY_CARE_PROVIDER_SITE_OTHER): Payer: Self-pay | Admitting: Family Medicine

## 2017-04-19 VITALS — BP 132/68 | HR 80 | Temp 98.6°F | Resp 16 | Ht 67.0 in | Wt 182.8 lb

## 2017-04-19 DIAGNOSIS — I10 Essential (primary) hypertension: Secondary | ICD-10-CM

## 2017-04-19 NOTE — Progress Notes (Signed)
Subjective:    Patient ID: Kelli Jordan is a 76 y.o. female.    HPI      Kelli Jordan in because of concerns with her BP. She has been randomly checking it and she has been getting elevated BP 170-190 systolic, usually in the AM. She takes her Amlodipine, Losarta and Labetalol in the AM and another Labetalol at night. She denies any symptoms referable to elevated BP.    The following portions of the patient's history were reviewed and updated as appropriate: allergies, current medications, past family history, past medical history, past social history, past surgical history and problem list.    Review of Systems   All other systems reviewed and are negative.          Objective:    Physical Exam   Constitutional: She is oriented to person, place, and time. She appears well-developed and well-nourished. No distress.   Marland Kitchen Appropriately groomed and in no apparent distress.   HENT:   Head: Normocephalic and atraumatic.   Right Ear: External ear normal.   Left Ear: External ear normal.   Nose: Nose normal.   Mouth/Throat: Oropharynx is clear and moist.   Eyes: Pupils are equal, round, and reactive to light. Conjunctivae and EOM are normal. No scleral icterus.   Neck: Normal range of motion. Neck supple. No JVD present. No thyromegaly present.   No carotid bruits noted.   Cardiovascular: Normal rate, regular rhythm, normal heart sounds and intact distal pulses.    No murmur heard.  Normal PMI   Pulmonary/Chest: Effort normal and breath sounds normal. She has no wheezes. She exhibits no tenderness.   Symmetric expansion, no dyspnea.   Lymphadenopathy:     She has no cervical adenopathy.   Neurological: She is alert and oriented to person, place, and time. No cranial nerve deficit.   Normal gait           Assessment:       1. Essential hypertension          Plan:       Take Amlodipine and Labetalol AM,  Losartan and Labetalol PM

## 2017-05-09 ENCOUNTER — Other Ambulatory Visit (INDEPENDENT_AMBULATORY_CARE_PROVIDER_SITE_OTHER): Payer: Self-pay | Admitting: Family Medicine

## 2017-05-15 ENCOUNTER — Other Ambulatory Visit (INDEPENDENT_AMBULATORY_CARE_PROVIDER_SITE_OTHER): Payer: Self-pay | Admitting: Family Medicine

## 2017-06-05 ENCOUNTER — Ambulatory Visit (INDEPENDENT_AMBULATORY_CARE_PROVIDER_SITE_OTHER): Payer: BLUE CROSS/BLUE SHIELD | Admitting: Family Medicine

## 2017-06-06 ENCOUNTER — Other Ambulatory Visit
Admission: RE | Admit: 2017-06-06 | Discharge: 2017-06-06 | Disposition: A | Discharge status: Home / Self Care | Payer: Medicare Other | Source: Ambulatory Visit | Attending: Family Medicine | Admitting: Family Medicine

## 2017-06-06 DIAGNOSIS — E7800 Pure hypercholesterolemia, unspecified: Secondary | ICD-10-CM

## 2017-06-06 DIAGNOSIS — Z79899 Other long term (current) drug therapy: Secondary | ICD-10-CM

## 2017-06-06 LAB — LIPID PANEL
Cholesterol: 174 mg/dL (ref 75–199)
Coronary Heart Disease Risk: 2.45
HDL: 71 mg/dL — ABNORMAL HIGH (ref 45–65)
LDL Calculated: 88 mg/dL
Triglycerides: 76 mg/dL (ref 10–150)
VLDL: 15 (ref 0–40)

## 2017-06-06 LAB — COMPREHENSIVE METABOLIC PANEL
ALT: 17 U/L (ref 0–55)
AST (SGOT): 18 U/L (ref 10–42)
Albumin/Globulin Ratio: 1.52 Ratio — ABNORMAL HIGH (ref 0.70–1.50)
Albumin: 4.1 gm/dL (ref 3.5–5.0)
Alkaline Phosphatase: 99 U/L (ref 40–145)
Anion Gap: 13.9 mMol/L (ref 7.0–18.0)
BUN / Creatinine Ratio: 16.2 Ratio (ref 10.0–30.0)
BUN: 22 mg/dL (ref 7–22)
Bilirubin, Total: 0.5 mg/dL (ref 0.1–1.2)
CO2: 23.1 mMol/L (ref 20.0–30.0)
Calcium: 9.6 mg/dL (ref 8.5–10.5)
Chloride: 112 mMol/L — ABNORMAL HIGH (ref 98–110)
Creatinine: 1.36 mg/dL — ABNORMAL HIGH (ref 0.60–1.20)
EGFR: 44 mL/min/{1.73_m2} — ABNORMAL LOW (ref 60–150)
Globulin: 2.7 gm/dL (ref 2.0–4.0)
Glucose: 101 mg/dL — ABNORMAL HIGH (ref 71–99)
Osmolality Calc: 292 mOsm/kg (ref 275–300)
Potassium: 4 mMol/L (ref 3.5–5.3)
Protein, Total: 6.8 gm/dL (ref 6.0–8.3)
Sodium: 145 mMol/L (ref 136–147)

## 2017-06-11 ENCOUNTER — Ambulatory Visit (INDEPENDENT_AMBULATORY_CARE_PROVIDER_SITE_OTHER): Payer: BLUE CROSS/BLUE SHIELD | Admitting: Family Medicine

## 2017-06-12 ENCOUNTER — Encounter (INDEPENDENT_AMBULATORY_CARE_PROVIDER_SITE_OTHER): Payer: Self-pay | Admitting: Family Medicine

## 2017-06-12 ENCOUNTER — Ambulatory Visit (INDEPENDENT_AMBULATORY_CARE_PROVIDER_SITE_OTHER): Payer: Medicare Other | Admitting: Family Medicine

## 2017-06-12 VITALS — BP 113/70 | HR 73 | Temp 98.6°F | Ht 67.0 in | Wt 179.6 lb

## 2017-06-12 DIAGNOSIS — Z23 Encounter for immunization: Secondary | ICD-10-CM

## 2017-06-12 DIAGNOSIS — E7800 Pure hypercholesterolemia, unspecified: Secondary | ICD-10-CM

## 2017-06-12 MED ORDER — LABETALOL HCL 200 MG PO TABS
200.0000 mg | ORAL_TABLET | Freq: Two times a day (BID) | ORAL | 3 refills | Status: DC
Start: 2017-06-12 — End: 2018-03-18

## 2017-06-12 NOTE — Progress Notes (Signed)
Date Time: 06/12/2017 10:56 AM  Patient Name: Kelli Jordan, Kelli Jordan  Primary Care Physician: Alfonse Alpers, MD      History of Presenting Illness:   Kelli Jordan is here today for Blood work results and Cholesterol  Check.  [name] cannot recall when the diagnosis was first made. The patient does not have a diet or regular exercise regimen. The patient has been compliant with medication intake. [name] denies any hyper cholesterol related symptoms or side effects from medication. Recent blood work shows   Lab Results   Component Value Date    CHOL 174 06/06/2017    HDL 71 (H) 06/06/2017    LDL 88 06/06/2017    TRIG 76 06/06/2017         Review of Systems   All other systems reviewed and are negative.         Past Medical History:     Past Medical History:   Diagnosis Date   . Gastroesophageal reflux disease    . Hyperlipidemia    . Hypertension    . Seasonal allergic rhinitis        Past Surgical History:     Past Surgical History:   Procedure Laterality Date   . DILATION AND CURETTAGE, DIAGNOSTIC / THERAPEUTIC     . eyelid surgery     . HYSTERECTOMY         Family History:     Family History   Problem Relation Age of Onset   . Diabetes Mother    . COPD Mother    . Dementia Father        Social History:     Social History     Social History   . Marital status: Single     Spouse name: N/A   . Number of children: N/A   . Years of education: N/A     Occupational History   . Not on file.     Social History Main Topics   . Smoking status: Never Smoker   . Smokeless tobacco: Never Used   . Alcohol use No   . Drug use: No   . Sexual activity: No     Other Topics Concern   . Not on file     Social History Narrative   . No narrative on file     History   Smoking Status   . Never Smoker   Smokeless Tobacco   . Never Used     History   Alcohol Use No     History   Drug Use No       Allergies:     Allergies   Allergen Reactions   . Aspirin Rash     Rash with higher doses, denies symptoms with Aspirin 81 mg,  managed by PCP per patient   . Iodine Rash     Contrast iodine only       Medications:     Prior to Admission medications    Medication Sig Start Date End Date Taking? Authorizing Provider   amLODIPine (NORVASC) 10 MG tablet TAKE 1 TAB BY MOUTH DAILY 04/08/17  Yes Keigan Girten, Valentino Saxon B, MD   aspirin EC 81 MG EC tablet Take 81 mg by mouth daily.   Yes [provider]   atorvastatin (LIPITOR) 40 MG tablet TAKE 1 TABLET (40 MG TOTAL) BY MOUTH DAILY. 05/15/17  Yes Camaya Gannett, Valentino Saxon B, MD   Diclofenac Sodium 1 % Cream Place 1 % onto the  skin 2 (two) times daily as needed.   Yes [provider]   fluticasone Aleda Grana) 50 MCG/ACT nasal spray  07/12/15  Yes [provider]   labetalol (NORMODYNE) 200 MG tablet Take 1 tablet (200 mg total) by mouth 2 (two) times daily. 06/12/17  Yes Alfonse Alpers, MD   loratadine (CLARITIN) 10 MG tablet    Yes [provider]   losartan (COZAAR) 100 MG tablet TAKE 1 TABLET (100 MG TOTAL) BY MOUTH DAILY. 05/09/17  Yes Randa Spike B, MD   prednisoLONE acetate (PRED FORTE) 1 % ophthalmic suspension USE 1 DROP INTO RIGHT EYE BID 08/23/16  Yes [provider]   Prenatal Vit-Fe Fumarate-FA (PRENATAL VITAMIN) 27-0.8 MG Tab    Yes [provider]   raNITIdine (ZANTAC) 300 MG tablet TAKE 1 TABLET BY MOUTH IN THE EVENING ANY FURTHER REFILL REQUESTS CAN BE DIRECTED TO PCP 03/12/16  Yes [provider]   labetalol (NORMODYNE) 200 MG tablet TAKE 1 TAB BY MOUTH TWICE DAILY 10/01/16 06/12/17 Yes Kana Reimann, Clotilde Dieter, MD       Vitals:    06/12/17 1035   BP: 113/70   Pulse: 73   Temp: 98.6 F (37 C)       Physical Exam   Constitutional: She is oriented to person, place, and time. She appears well-developed and well-nourished. No distress.   Obese   HENT:   Head: Normocephalic and atraumatic.   Right Ear: External ear normal.   Left Ear: External ear normal.   Nose: Nose normal.   Mouth/Throat: Oropharynx is clear and moist.   BIlateral TMs intact.    Eyes: Pupils are equal, round, and reactive to light. Conjunctivae and EOM are normal. Right eye exhibits no discharge. Left eye exhibits no discharge. No scleral icterus.   Neck: Normal range of motion. Neck supple. No JVD present. No tracheal deviation present. No thyromegaly present.   Bilateral carotids, no bruits   Cardiovascular: Normal rate, regular rhythm, normal heart sounds and intact distal pulses.    No murmur heard.  PMI normal.   Pulmonary/Chest: Effort normal and breath sounds normal. No respiratory distress. She has no wheezes. She has no rales. She exhibits no tenderness.   Abdominal: Bowel sounds are normal.   Lymphadenopathy:     She has no cervical adenopathy.   Neurological: She is alert and oriented to person, place, and time. She has normal reflexes. No cranial nerve deficit.   Skin: She is not diaphoretic.   Psychiatric: She has a normal mood and affect. Her behavior is normal. Judgment and thought content normal.   Vitals reviewed.            Assessment:     1. Hypercholesteremia     2. Need for vaccination  Flu vaccine QUAD PRES FREE 77YRS & GREATER          Plan:      No change in medication.   Counseling with regards to low salt, low fat diet, increase physical activity with a goal to lose weight. Lifestyle modification in general.  Discussed importance of primary prevention of CAD with aggressive treatment of high cholesterol, as well as benefits of taking daily low-dose aspirin.    Based on multiple chronic conditions that place the patient at significant 12 month risk for morbidity and mortality, this patient would benefit from chronic care management services and care coordination, which may include but will not be limited to medication reconciliation, prescription refills, appointment scheduling,  assistance with transportation to or from doctor appointment, nutritional guidance, escalation of care for specific symptoms, and disease specific guidance including preventive measures.      The patient is eligible for the Medicare covered benefit for Chronic Care Management (CCM). I discussed this with the patient, and the patient declines enrollment in the CCM program at this time.          Signed by: Alfonse Alpers, MD

## 2017-08-24 ENCOUNTER — Other Ambulatory Visit (INDEPENDENT_AMBULATORY_CARE_PROVIDER_SITE_OTHER): Payer: Self-pay | Admitting: Family Medicine

## 2017-09-30 ENCOUNTER — Other Ambulatory Visit (INDEPENDENT_AMBULATORY_CARE_PROVIDER_SITE_OTHER): Payer: Self-pay | Admitting: Family

## 2017-09-30 MED ORDER — AMLODIPINE BESYLATE 10 MG PO TABS
10.0000 mg | ORAL_TABLET | Freq: Every day | ORAL | 0 refills | Status: DC
Start: 2017-09-30 — End: 2018-01-09

## 2017-10-02 ENCOUNTER — Other Ambulatory Visit (INDEPENDENT_AMBULATORY_CARE_PROVIDER_SITE_OTHER): Payer: Self-pay | Admitting: Family Medicine

## 2017-10-02 MED ORDER — IRBESARTAN 75 MG PO TABS
75.0000 mg | ORAL_TABLET | Freq: Every evening | ORAL | 0 refills | Status: DC
Start: 2017-10-02 — End: 2017-12-31

## 2017-10-03 ENCOUNTER — Other Ambulatory Visit
Admission: RE | Admit: 2017-10-03 | Discharge: 2017-10-03 | Disposition: A | Discharge status: Home / Self Care | Payer: Medicare Other | Source: Ambulatory Visit | Attending: Family Medicine | Admitting: Family Medicine

## 2017-10-03 DIAGNOSIS — E7800 Pure hypercholesterolemia, unspecified: Secondary | ICD-10-CM

## 2017-10-03 DIAGNOSIS — I10 Essential (primary) hypertension: Secondary | ICD-10-CM

## 2017-10-03 LAB — COMPREHENSIVE METABOLIC PANEL
ALT: 15 U/L (ref 0–55)
AST (SGOT): 17 U/L (ref 10–42)
Albumin/Globulin Ratio: 1.39 Ratio (ref 0.70–1.50)
Albumin: 3.9 gm/dL (ref 3.5–5.0)
Alkaline Phosphatase: 111 U/L (ref 40–145)
Anion Gap: 11.9 mMol/L (ref 7.0–18.0)
BUN / Creatinine Ratio: 17.4 Ratio (ref 10.0–30.0)
BUN: 23 mg/dL — ABNORMAL HIGH (ref 7–22)
Bilirubin, Total: 0.5 mg/dL (ref 0.1–1.2)
CO2: 25.3 mMol/L (ref 20.0–30.0)
Calcium: 9.6 mg/dL (ref 8.5–10.5)
Chloride: 108 mMol/L (ref 98–110)
Creatinine: 1.32 mg/dL — ABNORMAL HIGH (ref 0.60–1.20)
EGFR: 45 mL/min/{1.73_m2} — ABNORMAL LOW (ref 60–150)
Globulin: 2.8 gm/dL (ref 2.0–4.0)
Glucose: 99 mg/dL (ref 71–99)
Osmolality Calc: 285 mOsm/kg (ref 275–300)
Potassium: 4.2 mMol/L (ref 3.5–5.3)
Protein, Total: 6.7 gm/dL (ref 6.0–8.3)
Sodium: 141 mMol/L (ref 136–147)

## 2017-10-03 LAB — LIPID PANEL
Cholesterol: 182 mg/dL (ref 75–199)
Coronary Heart Disease Risk: 2.46
HDL: 74 mg/dL — ABNORMAL HIGH (ref 45–65)
LDL Calculated: 94 mg/dL
Triglycerides: 70 mg/dL (ref 10–150)
VLDL: 14 (ref 0–40)

## 2017-10-03 NOTE — Progress Notes (Signed)
Date Time: 10/04/2017 1:20 PM  Patient Name: Kelli Jordan, Kelli Jordan  Primary Care Physician: Alfonse Alpers, MD    D  History of Presenting Illness:   Kelli Jordan is here today for Blood work results and Cholesterol  Check.  Kelli Jordan cannot recall when the diagnosis was first made. The patient does not have a diet or regular exercise regimen. The patient has been compliant with medication intake. Kelli Jordan denies any hyper cholesterol related symptoms or side effects from medication. Recent blood work shows   Lab Results   Component Value Date    CHOL 182 10/03/2017    HDL 74 (H) 10/03/2017    LDL 94 10/03/2017    TRIG 70 10/03/2017     Losartan stopped (tanited). Called in Ibesartan 75 mg QD  [name] also complains of some diarrhea that started yesterday. She admits to at least 4-5x yesterday and then this morning so far 2. She denies any nausea or vomiting, no sick contact; no recent travel. She bought herself some "Pepto".    Review of Systems   Constitutional: Negative.    HENT: Negative.    Eyes: Negative.    Respiratory: Negative.    Cardiovascular: Negative.    Gastrointestinal: Positive for diarrhea.   Genitourinary: Negative.    Musculoskeletal: Negative.    Skin: Negative.    Neurological: Negative.    Endo/Heme/Allergies: Negative.    Psychiatric/Behavioral: Negative.           Past Medical History:     Past Medical History:   Diagnosis Date   . Gastroesophageal reflux disease    . Hyperlipidemia    . Hypertension    . Seasonal allergic rhinitis        Past Surgical History:     Past Surgical History:   Procedure Laterality Date   . DILATION AND CURETTAGE, DIAGNOSTIC / THERAPEUTIC     . eyelid surgery     . HYSTERECTOMY         Family History:     Family History   Problem Relation Age of Onset   . Diabetes Mother    . COPD Mother    . Dementia Father        Social History:     Social History     Social History   . Marital status: Single     Spouse name: N/A   . Number of children: N/A   . Years  of education: N/A     Occupational History   . Not on file.     Social History Main Topics   . Smoking status: Never Smoker   . Smokeless tobacco: Never Used   . Alcohol use No   . Drug use: No   . Sexual activity: No     Other Topics Concern   . Not on file     Social History Narrative   . No narrative on file     History   Smoking Status   . Never Smoker   Smokeless Tobacco   . Never Used     History   Alcohol Use No     History   Drug Use No       Allergies:     Allergies   Allergen Reactions   . Aspirin Rash     Rash with higher doses, denies symptoms with Aspirin 81 mg, managed by PCP per patient   . Iodine Rash     Contrast iodine only  Medications:     Prior to Admission medications    Medication Sig Start Date End Date Taking? Authorizing Provider   amLODIPine (NORVASC) 10 MG tablet Take 1 tablet (10 mg total) by mouth daily. 09/30/17   Danella Deis, NP   aspirin EC 81 MG EC tablet Take 81 mg by mouth daily.    [provider]   atorvastatin (LIPITOR) 40 MG tablet TAKE 1 TABLET (40 MG TOTAL) BY MOUTH DAILY. 05/15/17   Alfonse Alpers, MD   Diclofenac Sodium 1 % Cream Place 1 % onto the skin 2 (two) times daily as needed.    [provider]   fluticasone Aleda Grana) 50 MCG/ACT nasal spray  07/12/15   [provider]   irbesartan (AVAPRO) 75 MG tablet Take 1 tablet (75 mg total) by mouth nightly. 10/02/17   Alfonse Alpers, MD   labetalol (NORMODYNE) 200 MG tablet Take 1 tablet (200 mg total) by mouth 2 (two) times daily. 06/12/17   Alfonse Alpers, MD   loratadine (CLARITIN) 10 MG tablet     [provider]   prednisoLONE acetate (PRED FORTE) 1 % ophthalmic suspension USE 1 DROP INTO RIGHT EYE BID 08/23/16   [provider]   Prenatal Vit-Fe Fumarate-FA (PRENATAL VITAMIN) 27-0.8 MG Tab     [provider]   raNITIdine (ZANTAC) 300 MG tablet TAKE 1 TABLET BY MOUTH IN THE EVENING ANY FURTHER REFILL REQUESTS CAN BE DIRECTED TO PCP 03/12/16   [provider]       Vitals:    10/04/17 1234   BP: 130/70   Pulse: 82   Resp: 16   Temp: 98.1 F (36.7 C)       Physical Exam   Constitutional: She is oriented to person, place, and time. She appears well-developed and well-nourished. No distress.   Obese   HENT:   Head: Normocephalic and atraumatic.   BIlateral TMs intact.   Eyes: Pupils are equal, round, and reactive to light. Conjunctivae and EOM are normal. Right eye exhibits no discharge. Left eye exhibits no discharge. No scleral icterus.   Neck: Normal range of motion. Neck supple.   Bilateral carotids, no bruits   Cardiovascular: Normal rate, regular rhythm, normal heart sounds and intact distal pulses.    No murmur heard.  PMI normal.   Pulmonary/Chest: Effort normal and breath sounds normal. No respiratory distress. She has no wheezes. She has no rales. She exhibits no tenderness.   Abdominal: Soft. She exhibits no distension. There is no tenderness.   Hyperactive BS   Lymphadenopathy:     She has no cervical adenopathy.   Neurological: She is alert and oriented to person, place, and time. She has normal reflexes. No cranial nerve deficit.   Skin: She is not diaphoretic.   Psychiatric: She has a normal mood and affect. Her behavior is normal. Judgment and thought content normal.   Vitals reviewed.            Assessment:     1. Hypercholesteremia     2. Essential hypertension     3. Diarrhea, unspecified type            Plan:      No change in medication.   Counseling with regards to low salt, low fat diet, increase physical activity with a goal to lose weight. Lifestyle modification in general.  Discussed importance of primary prevention of CAD with aggressive treatment of high cholesterol, as well as benefits  of taking daily low-dose aspirin.  Supportive management for diarrhea. Increase oral fluid intake, avoid anti-diarrheal          Signed by: Alfonse Alpers, MD

## 2017-10-04 ENCOUNTER — Encounter (INDEPENDENT_AMBULATORY_CARE_PROVIDER_SITE_OTHER): Payer: Self-pay | Admitting: Family Medicine

## 2017-10-04 ENCOUNTER — Ambulatory Visit (INDEPENDENT_AMBULATORY_CARE_PROVIDER_SITE_OTHER): Payer: Medicare Other | Admitting: Family Medicine

## 2017-10-04 ENCOUNTER — Telehealth (INDEPENDENT_AMBULATORY_CARE_PROVIDER_SITE_OTHER): Payer: Self-pay

## 2017-10-04 VITALS — BP 130/70 | HR 82 | Temp 98.1°F | Resp 16 | Ht 67.0 in | Wt 185.2 lb

## 2017-10-04 DIAGNOSIS — E7800 Pure hypercholesterolemia, unspecified: Secondary | ICD-10-CM

## 2017-10-04 DIAGNOSIS — R197 Diarrhea, unspecified: Secondary | ICD-10-CM

## 2017-10-04 DIAGNOSIS — I10 Essential (primary) hypertension: Secondary | ICD-10-CM

## 2017-10-04 NOTE — Telephone Encounter (Signed)
Informed pt that Ibesarten 75mg  called in to replace losaten that had been recalled.

## 2017-10-04 NOTE — Telephone Encounter (Signed)
Informed pt new med replaced losarton

## 2017-11-21 ENCOUNTER — Other Ambulatory Visit (INDEPENDENT_AMBULATORY_CARE_PROVIDER_SITE_OTHER): Payer: Self-pay | Admitting: Family Medicine

## 2017-12-02 ENCOUNTER — Encounter (INDEPENDENT_AMBULATORY_CARE_PROVIDER_SITE_OTHER): Payer: Self-pay | Admitting: Family Medicine

## 2017-12-02 ENCOUNTER — Ambulatory Visit (INDEPENDENT_AMBULATORY_CARE_PROVIDER_SITE_OTHER): Payer: Medicare Other | Admitting: Family Medicine

## 2017-12-02 ENCOUNTER — Other Ambulatory Visit (INDEPENDENT_AMBULATORY_CARE_PROVIDER_SITE_OTHER): Payer: Self-pay | Admitting: Family Medicine

## 2017-12-02 VITALS — BP 138/75 | HR 79 | Temp 98.8°F | Resp 16 | Ht 67.0 in | Wt 184.0 lb

## 2017-12-02 DIAGNOSIS — Z0000 Encounter for general adult medical examination without abnormal findings: Secondary | ICD-10-CM

## 2017-12-02 DIAGNOSIS — I10 Essential (primary) hypertension: Secondary | ICD-10-CM

## 2017-12-02 DIAGNOSIS — E7800 Pure hypercholesterolemia, unspecified: Secondary | ICD-10-CM

## 2017-12-02 NOTE — Progress Notes (Signed)
Kelli Jordan is a 77 y.o. female who presents today for a Medicare Annual Wellness Visit.     Health Risk Assessment     During the past month, how would you rate your general health?:   FAIR  Which of the following tasks can you do without assistance - drive or take the bus alone; shop for groceries or clothes; prepare your own meals; do your own housework/laundry; handle your own finances/pay bills; eat, bathe or get around your home?:   ALL  Which of the following problems have you been bothered by in the past month - dizzy when standing up; problems using the phone; feeling tired or fatigued; moderate or severe body pain?:  RIGHT HIP PAIN  Do you exercise for about 20 minutes 3 or more days per week?:   NO  During the past month was someone available to help if you needed and wanted help?  For example, if you felt nervous, lonely, got sick and had to stay in bed, needed someone to talk to, needed help with daily chores or needed help just taking care of yourself.:  YES  Do you always wear a seat belt?:  YES  Do you have any trouble taking medications the way you have been told to take them?:  NO  Have you been given any information that can help you with keeping track of your medications?:  NO  Do you have trouble paying for your medications?:  NO  Have you been given any information that can help you with hazards in your house, such as scatter rugs, furniture, etc?:  NO  Do you feel unsteady when standing or walking?:  NO  Do you worry about falling?:  NO  Have you fallen two or more times in the past year?:  NO  Did you suffer any injuries from your falls in the past year?:      Additional Concerns Kelli Jordan has been complaining of right TMJ pain    Patient Care Team:  Alfonse Alpers, MD as PCP - General (Family Medicine)    Past Medical History:   Diagnosis Date   . Gastroesophageal reflux disease    . Hyperlipidemia    . Hypertension    . Seasonal allergic rhinitis      Past Surgical History:   Procedure  Laterality Date   . DILATION AND CURETTAGE, DIAGNOSTIC / THERAPEUTIC     . eyelid surgery     . HYSTERECTOMY     . OTHER SURGICAL HISTORY      steriod injection in right hip      Allergies   Allergen Reactions   . Aspirin Rash     Rash with higher doses, denies symptoms with Aspirin 81 mg, managed by PCP per patient   . Iodine Rash     Contrast iodine only      Current Outpatient Prescriptions   Medication Sig Dispense Refill   . amLODIPine (NORVASC) 10 MG tablet Take 1 tablet (10 mg total) by mouth daily. 90 tablet 0   . aspirin EC 81 MG EC tablet Take 81 mg by mouth daily.     Marland Kitchen atorvastatin (LIPITOR) 40 MG tablet TAKE 1 TABLET (40 MG TOTAL) BY MOUTH DAILY. 90 tablet 0   . bacitracin 500 UNIT/GM ointment Apply topically 2 (two) times daily.     Marland Kitchen bismuth subsalicylate (PEPTO BISMOL) 262 MG/15ML suspension Take 15 mLs by mouth.     . Diclofenac Sodium 1 % Cream Place  1 % onto the skin 2 (two) times daily as needed.     . fluticasone (FLONASE) 50 MCG/ACT nasal spray      . irbesartan (AVAPRO) 75 MG tablet Take 1 tablet (75 mg total) by mouth nightly. 90 tablet 0   . labetalol (NORMODYNE) 200 MG tablet Take 1 tablet (200 mg total) by mouth 2 (two) times daily. 180 tablet 3   . loratadine (CLARITIN) 10 MG tablet      . meloxicam (MOBIC) 7.5 MG tablet TAKE 1 TABLET BY MOUTH ONCE DAILY WITH FOOD  3   . prednisoLONE acetate (PRED FORTE) 1 % ophthalmic suspension USE 1 DROP INTO RIGHT EYE BID  1   . Prenatal Vit-Fe Fumarate-FA (PRENATAL VITAMIN) 27-0.8 MG Tab      . raNITIdine (ZANTAC) 300 MG tablet TAKE 1 TABLET BY MOUTH IN THE EVENING ANY FURTHER REFILL REQUESTS CAN BE DIRECTED TO PCP  0     No current facility-administered medications for this visit.       Social History   Substance Use Topics   . Smoking status: Never Smoker   . Smokeless tobacco: Never Used   . Alcohol use No      Family History   Problem Relation Age of Onset   . Diabetes Mother    . COPD Mother    . Dementia Father         The following sections  were reviewed this encounter by the provider:        Hospitalizations  no hospitalizations within past year    Depression Screening    See related Activity or Flowsheet    Functional Ability    Falls Risk:  home does not have throw rugs, poor lighting or a slippery bath tub or shower  Hearing:  hearing within normal limits  Exercise:  Light ( i.e. stretching or slow walking )  ADL's:   Bathing - independent   Dressing - independent   Mobility - independent   Transfer - independent   Eating - independent}   Toileting - independent   ADL assistance not needed    Discussion of Advance Directives: Has an Advanced Directive. A copy has not been provided. Requested to provide.     Assessment    BP 138/75 (BP Site: Right arm, Patient Position: Sitting, Cuff Size: Medium)   Pulse 79   Temp 98.8 F (37.1 C) (Oral)   Resp 16   Ht 1.702 m (5\' 7" )   Wt 83.5 kg (184 lb)   BMI 28.82 kg/m      Vision Screening (required for IPPE only): Not performed  Screening EKG (IPPE only): not indicated        Evaluation of Cognitive Function    Mood/affect: Appropriate  Appearance: neatly groomed, appropriately and adequately nourished  Family member/caregiver input: Not present        AWV Mini-Cog Result:  3 recalled words - negative screen for dementia    Assessment/Plan    1. Medicare annual wellness visit, subsequent      High BMI Follow Up       Alfonse Alpers, MD  12/02/2017

## 2017-12-24 ENCOUNTER — Other Ambulatory Visit (INDEPENDENT_AMBULATORY_CARE_PROVIDER_SITE_OTHER): Payer: Self-pay | Admitting: Family

## 2017-12-31 ENCOUNTER — Other Ambulatory Visit (INDEPENDENT_AMBULATORY_CARE_PROVIDER_SITE_OTHER): Payer: Self-pay | Admitting: Family Medicine

## 2018-01-06 ENCOUNTER — Telehealth (INDEPENDENT_AMBULATORY_CARE_PROVIDER_SITE_OTHER): Payer: Self-pay

## 2018-01-06 NOTE — Telephone Encounter (Signed)
Left message for pt that she can take Tramadol per Dr Junius Roads.

## 2018-01-09 ENCOUNTER — Other Ambulatory Visit (INDEPENDENT_AMBULATORY_CARE_PROVIDER_SITE_OTHER): Payer: Self-pay | Admitting: Family Medicine

## 2018-01-09 MED ORDER — AMLODIPINE BESYLATE 10 MG PO TABS
10.00 mg | ORAL_TABLET | Freq: Every day | ORAL | 1 refills | Status: DC
Start: 2018-01-09 — End: 2018-04-27

## 2018-01-10 ENCOUNTER — Ambulatory Visit (INDEPENDENT_AMBULATORY_CARE_PROVIDER_SITE_OTHER): Payer: BLUE CROSS/BLUE SHIELD | Admitting: Family Medicine

## 2018-01-13 ENCOUNTER — Ambulatory Visit (INDEPENDENT_AMBULATORY_CARE_PROVIDER_SITE_OTHER): Payer: Medicare Other | Admitting: Family Medicine

## 2018-01-13 ENCOUNTER — Encounter (INDEPENDENT_AMBULATORY_CARE_PROVIDER_SITE_OTHER): Payer: Self-pay | Admitting: Family Medicine

## 2018-01-13 VITALS — BP 137/72 | HR 84 | Temp 99.0°F | Resp 16 | Ht 67.0 in | Wt 184.0 lb

## 2018-01-13 DIAGNOSIS — M25551 Pain in right hip: Secondary | ICD-10-CM

## 2018-01-13 DIAGNOSIS — M26629 Arthralgia of temporomandibular joint, unspecified side: Secondary | ICD-10-CM

## 2018-01-13 MED ORDER — TRAMADOL HCL 50 MG PO TABS
ORAL_TABLET | ORAL | 0 refills | Status: DC
Start: 2018-01-13 — End: 2018-03-18

## 2018-01-13 NOTE — Progress Notes (Signed)
Subjective:    Patient ID: Kelli Jordan is a 77 y.o. female.    HPI             Giani comes in because she has been having miserable pain in her right hip. This has been going on for several months now. She had a joint injection last February which did not last. She is selling her house here and will be moving to [place] once it is sold. She had seen an Ortho in [place], but it seems that her home will not be sold for a while . She had tried Diclofenac, but did not help. She gets stiff when getting up from prolonged sitting; getting in and out of the car has been miserable.    She also has pain in her right TMJ, but she is more concerned about the "clicking " sound that her left TMJ makes. Occasionally, she has pain with chewing .She denies any swelling .    The following portions of the patient's history were reviewed and updated as appropriate: allergies, current medications, past family history, past medical history, past social history, past surgical history and problem list.    Review of Systems   Constitutional: Negative.    HENT: Negative.    Eyes: Negative.    Respiratory: Negative.    Cardiovascular: Negative.    Gastrointestinal: Negative.    Endocrine: Negative.    Musculoskeletal: Negative.         Right hip pain   Skin: Negative.    Allergic/Immunologic: Negative.    Neurological: Negative.    Hematological: Negative.    Psychiatric/Behavioral: Negative.            Objective:    Physical Exam   Constitutional: She is oriented to person, place, and time. She appears well-developed and well-nourished.   Obese. Appropriately groomed. Not in any form of distress.   HENT:   Head: Normocephalic and atraumatic.   Right Ear: External ear normal.   Left Ear: External ear normal.   Mouth/Throat: Oropharynx is clear and moist. No oropharyngeal exudate.   Point tenderness right TMJ, but with clicking sound left   Eyes: Pupils are equal, round, and reactive to light. Conjunctivae and EOM are normal. No scleral  icterus.   Neck: Normal range of motion. Neck supple. No JVD present. No thyromegaly present.   No carotid bruits noted.     Cardiovascular: Normal rate, regular rhythm and intact distal pulses.    No murmur heard.  Normal PMI   Pulmonary/Chest: Effort normal and breath sounds normal. No respiratory distress. She has no wheezes. She has no rales. She exhibits no tenderness.   Symmetric expansion, no dyspne.   Abdominal:   No bruits appreciated.   Musculoskeletal: She exhibits no tenderness.   Lymphadenopathy:     She has no cervical adenopathy.   Neurological: She is alert and oriented to person, place, and time. She has normal reflexes. She displays normal reflexes. No cranial nerve deficit. Coordination normal.   Skin: Skin is warm and dry. No rash noted.   Psychiatric: She has a normal mood and affect. Her behavior is normal. Judgment and thought content normal.   Grossly normal memory.   Vitals reviewed.          Assessment:       1. Pain of right hip joint    2. TMJ pain dysfunction syndrome          Plan:  Advised patient to check with Dr. Kristeen Mans, if he will do it while she is waiting for her home to be sold.  Reassurance with the TMJ clicking. Eat slowly, avoid chewing gum, cut small pisces of meat.

## 2018-02-11 ENCOUNTER — Ambulatory Visit (INDEPENDENT_AMBULATORY_CARE_PROVIDER_SITE_OTHER): Payer: Medicare Other | Admitting: Family Medicine

## 2018-02-15 ENCOUNTER — Other Ambulatory Visit (INDEPENDENT_AMBULATORY_CARE_PROVIDER_SITE_OTHER): Payer: Self-pay | Admitting: Family Medicine

## 2018-02-19 ENCOUNTER — Other Ambulatory Visit (INDEPENDENT_AMBULATORY_CARE_PROVIDER_SITE_OTHER): Payer: Self-pay | Admitting: Family Medicine

## 2018-02-19 MED ORDER — ATORVASTATIN CALCIUM 40 MG PO TABS
40.00 mg | ORAL_TABLET | Freq: Every day | ORAL | 0 refills | Status: DC
Start: 2018-02-19 — End: 2018-03-18

## 2018-03-18 ENCOUNTER — Ambulatory Visit (INDEPENDENT_AMBULATORY_CARE_PROVIDER_SITE_OTHER): Payer: Medicare Other | Admitting: Family Medicine

## 2018-03-18 ENCOUNTER — Encounter (INDEPENDENT_AMBULATORY_CARE_PROVIDER_SITE_OTHER): Payer: Self-pay | Admitting: Family Medicine

## 2018-03-18 VITALS — BP 136/63 | HR 67 | Temp 98.4°F | Resp 16 | Ht 67.0 in | Wt 183.8 lb

## 2018-03-18 DIAGNOSIS — H1131 Conjunctival hemorrhage, right eye: Secondary | ICD-10-CM

## 2018-03-18 MED ORDER — IRBESARTAN 75 MG PO TABS
75.00 mg | ORAL_TABLET | Freq: Every evening | ORAL | 0 refills | Status: DC
Start: 2018-03-18 — End: 2018-04-09

## 2018-03-18 MED ORDER — ATORVASTATIN CALCIUM 40 MG PO TABS
40.00 mg | ORAL_TABLET | Freq: Every day | ORAL | 0 refills | Status: AC
Start: 2018-03-18 — End: ?

## 2018-03-18 MED ORDER — RANITIDINE HCL 300 MG PO TABS
300.00 mg | ORAL_TABLET | Freq: Every day | ORAL | 0 refills | Status: AC
Start: 2018-03-18 — End: ?

## 2018-03-18 MED ORDER — LABETALOL HCL 200 MG PO TABS
200.00 mg | ORAL_TABLET | Freq: Two times a day (BID) | ORAL | 1 refills | Status: DC
Start: 2018-03-18 — End: 2018-07-04

## 2018-03-18 NOTE — Progress Notes (Signed)
Subjective:    Patient ID: Kelli Jordan is a 77 y.o. female.    HPI          Breshay comes I because shehas sudden onset of "subconjunctival h'ge" in her right eye. She noted this Sunday when she woke up. She denies any eye pain, blurry vision, but admits to tearing. 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 No recent trauma or injury; no sudden episode of sneezing or violent coughing.    The following portions of the patient's history were reviewed and updated as appropriate: allergies, current medications, past family history, past medical history, past social history, past surgical history and problem list.    Review of Systems   Constitutional: Negative.    HENT: Negative.    Eyes: Positive for redness.   Respiratory: Negative.    Cardiovascular: Negative.    Gastrointestinal: Negative.    Endocrine: Negative.    Musculoskeletal: Negative.    Skin: Negative.    Allergic/Immunologic: Negative.    Neurological: Negative.    Hematological: Negative.    Psychiatric/Behavioral: Negative.            Objective:    Physical Exam   Constitutional: She appears well-developed and well-nourished.   HENT:   Head: Normocephalic and atraumatic.   Eyes: Pupils are equal, round, and reactive to light. EOM are normal.   Subconjunctival hemorrhage whole of right eye, some noticeable bulgingn noted with thright eyeball.   Neck: Normal range of motion. Neck supple.   Cardiovascular: Normal rate, regular rhythm and normal heart sounds.    Pulmonary/Chest: Effort normal and breath sounds normal.           Assessment:       1. Subconjunctival hemorrhage of right eye          Plan:       Because of her history of retinal edema, Ophthalmology consult requested.

## 2018-03-27 ENCOUNTER — Ambulatory Visit (INDEPENDENT_AMBULATORY_CARE_PROVIDER_SITE_OTHER): Payer: BLUE CROSS/BLUE SHIELD | Admitting: Family Medicine

## 2018-04-09 ENCOUNTER — Other Ambulatory Visit (INDEPENDENT_AMBULATORY_CARE_PROVIDER_SITE_OTHER): Payer: Self-pay | Admitting: Family Medicine

## 2018-04-09 MED ORDER — IRBESARTAN 75 MG PO TABS
75.00 mg | ORAL_TABLET | Freq: Every evening | ORAL | 1 refills | Status: DC
Start: 2018-04-09 — End: 2018-10-01

## 2018-04-27 ENCOUNTER — Other Ambulatory Visit (INDEPENDENT_AMBULATORY_CARE_PROVIDER_SITE_OTHER): Payer: Self-pay | Admitting: Family Medicine

## 2018-07-04 ENCOUNTER — Other Ambulatory Visit (INDEPENDENT_AMBULATORY_CARE_PROVIDER_SITE_OTHER): Payer: Self-pay | Admitting: Family Medicine

## 2018-08-06 ENCOUNTER — Encounter (INDEPENDENT_AMBULATORY_CARE_PROVIDER_SITE_OTHER): Payer: Self-pay

## 2018-09-03 ENCOUNTER — Encounter (INDEPENDENT_AMBULATORY_CARE_PROVIDER_SITE_OTHER): Payer: Self-pay

## 2018-09-24 ENCOUNTER — Encounter (INDEPENDENT_AMBULATORY_CARE_PROVIDER_SITE_OTHER): Payer: Self-pay

## 2018-10-01 ENCOUNTER — Other Ambulatory Visit (INDEPENDENT_AMBULATORY_CARE_PROVIDER_SITE_OTHER): Payer: Self-pay | Admitting: Family Medicine

## 2018-10-18 ENCOUNTER — Other Ambulatory Visit (INDEPENDENT_AMBULATORY_CARE_PROVIDER_SITE_OTHER): Payer: Self-pay | Admitting: Family Medicine

## 2018-10-29 ENCOUNTER — Encounter (INDEPENDENT_AMBULATORY_CARE_PROVIDER_SITE_OTHER): Payer: Self-pay

## 2018-11-23 ENCOUNTER — Encounter (INDEPENDENT_AMBULATORY_CARE_PROVIDER_SITE_OTHER): Payer: Self-pay

## 2018-12-07 ENCOUNTER — Other Ambulatory Visit (INDEPENDENT_AMBULATORY_CARE_PROVIDER_SITE_OTHER): Payer: Self-pay | Admitting: Family Medicine

## 2018-12-24 ENCOUNTER — Encounter (INDEPENDENT_AMBULATORY_CARE_PROVIDER_SITE_OTHER): Payer: Self-pay

## 2019-01-05 ENCOUNTER — Other Ambulatory Visit (INDEPENDENT_AMBULATORY_CARE_PROVIDER_SITE_OTHER): Payer: Self-pay | Admitting: Family Medicine

## 2021-08-20 IMAGING — MR MRI LUMBAR SPINE WITHOUT CONTRAST
4 of 6 series · 25 of 48 positions shown · IV contrast (gadolinium)
Comparison: None

________________________________________________________________________________________________ 
MRI LUMBAR SPINE WITHOUT CONTRAST, 08/20/2021 [DATE]: 
CLINICAL INDICATION: Evaluate for Modic changes, low back pain
TECHNIQUE: Sagittal T1, Sagittal T2, Sagittal STIR, Axial T1 and Axial T2 MR 
images of the lumbar spine were performed without intravenous gadolinium 
enhancement.

[Series 101: survey · axial · 10.0mm · 1.39mm/px · z∈[-33,+201]mm · 6 of 10 slices shown]
[im 1/10]
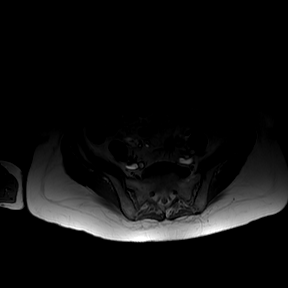
[im 2/10]
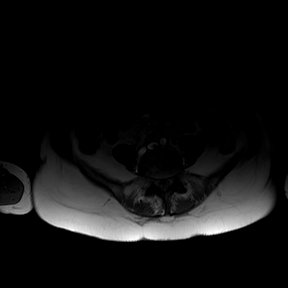
[im 4/10]
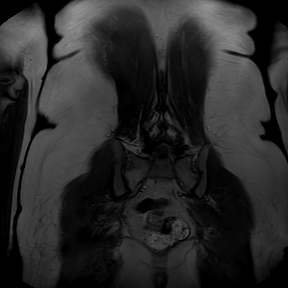
[im 6/10]
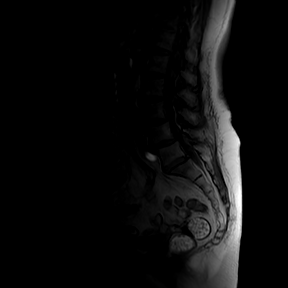
[im 8/10]
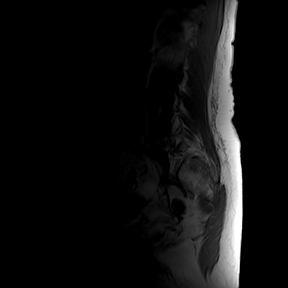
[im 10/10]
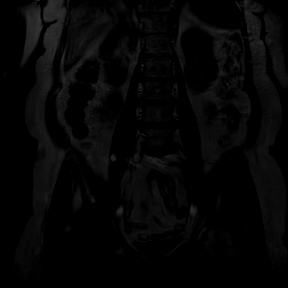

[Series 201: t2w_cor-surv · coronal · 6.0mm · 0.60mm/px · 3 of 5 slices shown]
[im 1/5]
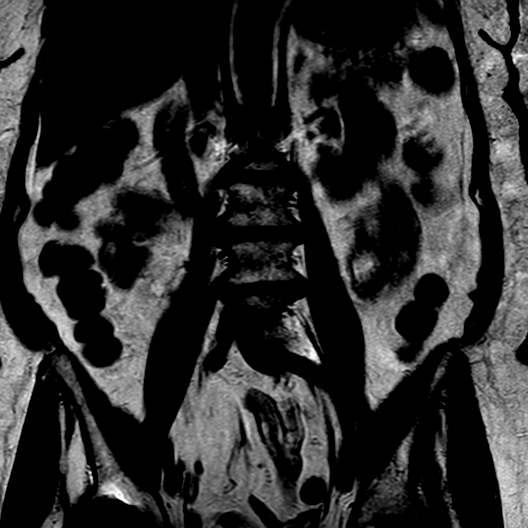
[im 3/5]
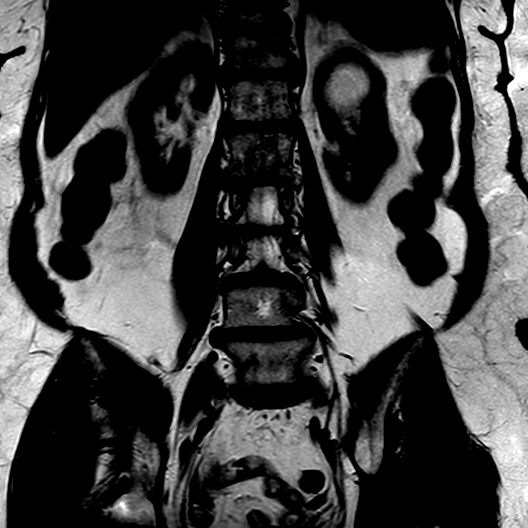
[im 5/5]
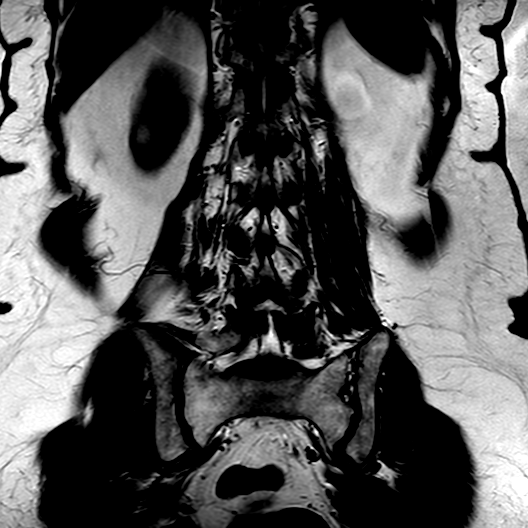

[Series 301: t1_tse_sag · sagittal · 4.0mm · 0.47mm/px · 8 of 17 slices shown]
[im 1/17]
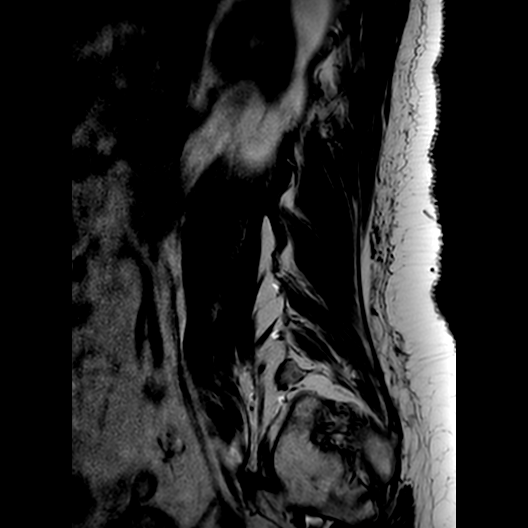
[im 3/17]
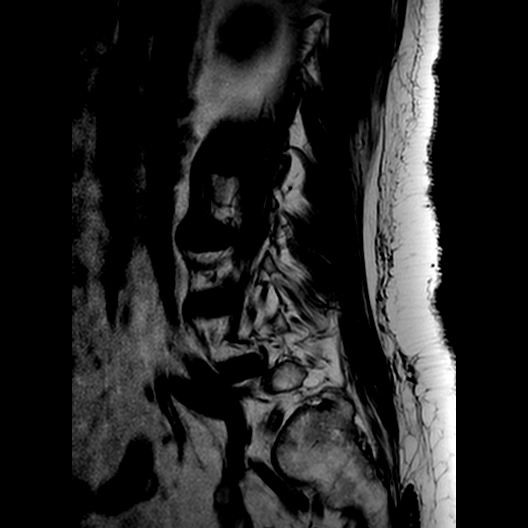
[im 5/17]
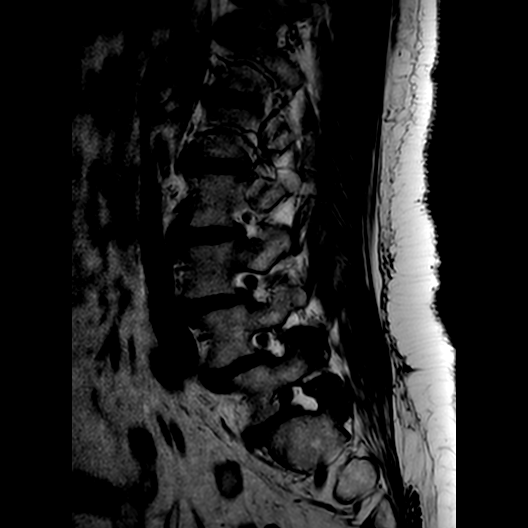
[im 7/17]
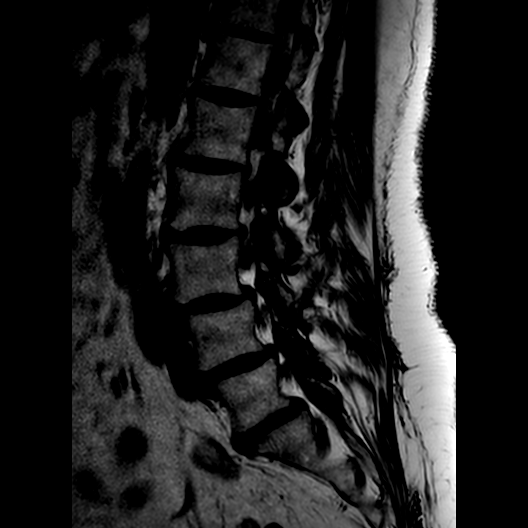
[im 10/17]
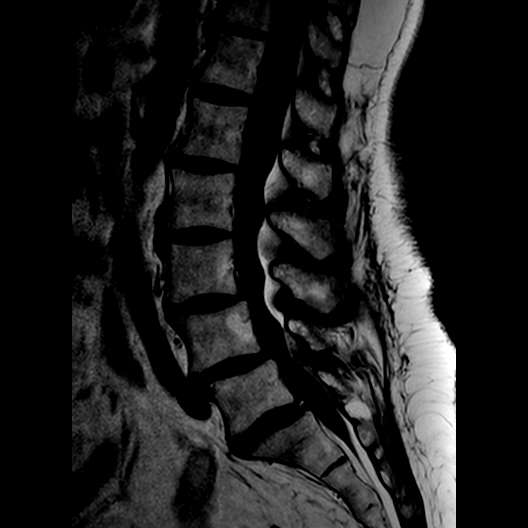
[im 12/17]
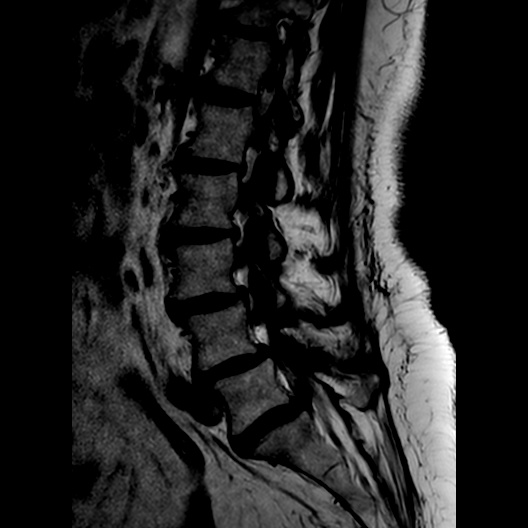
[im 14/17]
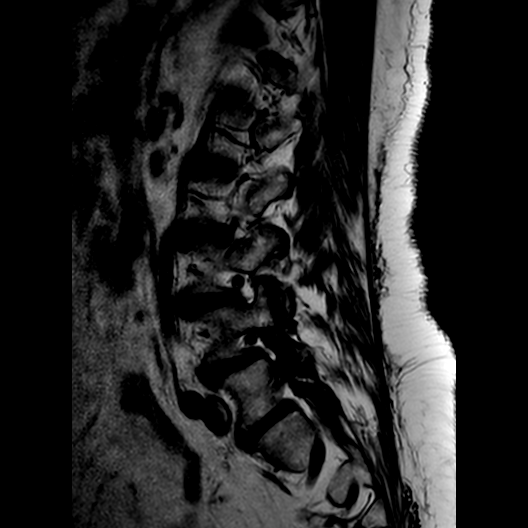
[im 17/17]
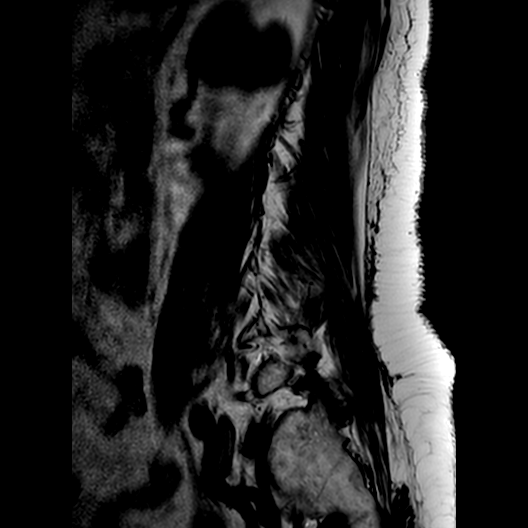

[Series 401: stir_sag · sagittal · 4.0mm · 0.56mm/px · 8 of 17 slices shown]
[im 1/17]
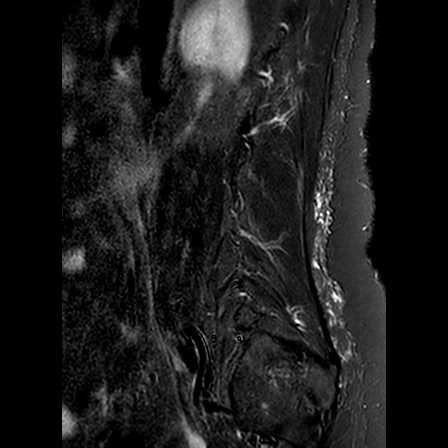
[im 3/17]
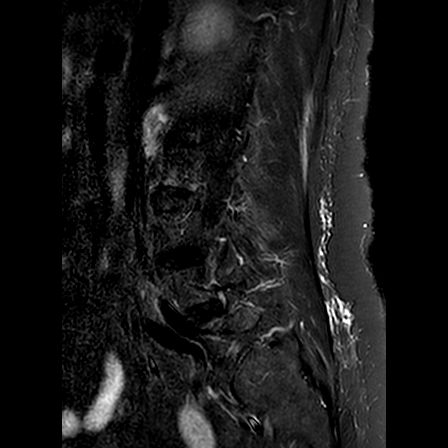
[im 5/17]
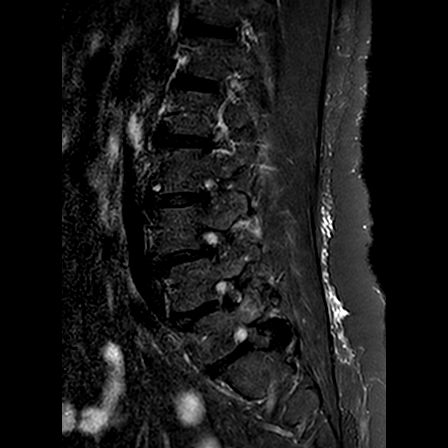
[im 7/17]
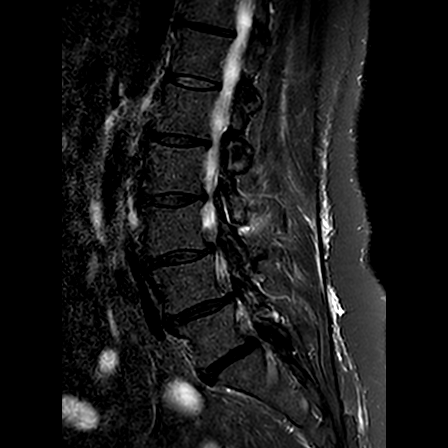
[im 10/17]
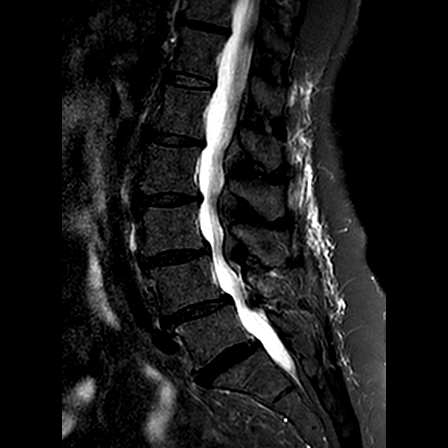
[im 12/17]
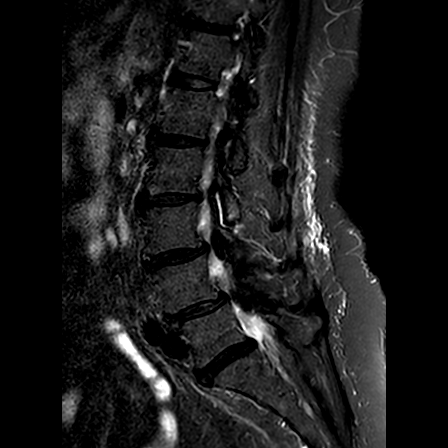
[im 14/17]
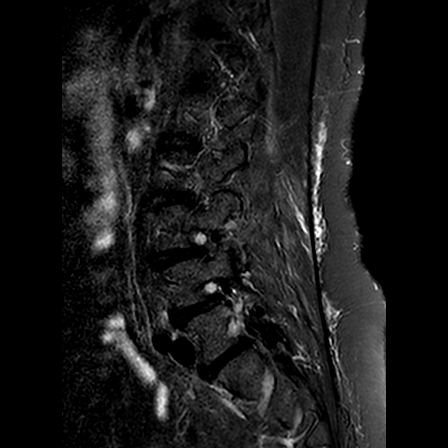
[im 17/17]
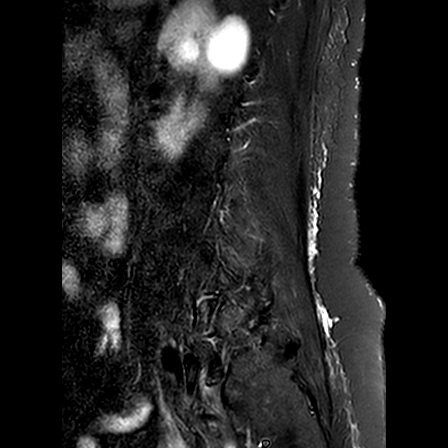

[25 of 48 positions shown; findings below may reference images not displayed]

FINDINGS: There are no Modic type I or type II changes. 
Vertebral heights are intact. Less than grade 1 degenerative anterolisthesis at 
L3-4 and L4-5, slight anterolisthesis at L5-S1. No pars defect. 
Moderate canal stenosis at L4-5, mild to moderate right-sided stenosis at L3-4, 
mild stenosis at L2-3. The canal is open at L1-2 and L5-S1. 
There is no significant foraminal stenosis. No fracture or malignancy. No 
scoliosis. Right hip prosthesis.
IMPRESSION: Degenerative changes, with moderate canal stenosis L4-5. 
No Modic changes identified. There is mild degenerative reactive edema involving 
the L4-5 facet pillars bilaterally.

## 2021-12-06 IMAGING — CT CT BRAIN WITHOUT CONTRAST
3 of 4 series · 14 of 47 positions shown, 16 images · non-contrast
Comparison: None

________________________________________________________________________________________________ 
CT BRAIN WITHOUT CONTRAST, 12/06/2021 [DATE]: 
CLINICAL INDICATION: 80 year-old female with daily persistent headache. Fall 
August 2021. 
A search for DICOM formatted images was conducted for prior CT imaging studies 
completed at a non-affiliated media free facility.
TECHNIQUE: The head was scanned from vertex through skull base without contrast 
on a high resolution CT scanner using dose reduction techniques. Routine MPR 
reconstructions were performed.

[Series 2: head w/o 3.0 j30s 1 ax · axial · non-contrast · 0.33mm/px · z∈[-178,-44]mm · 8 of 54 slices shown, 10 images]
[im 4/54  brain]
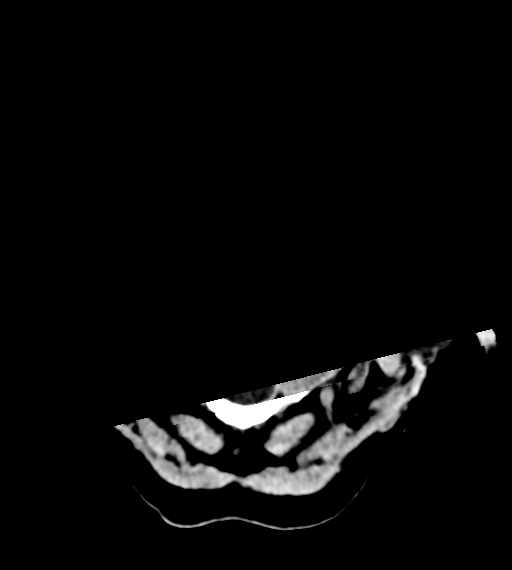
[im 4/54  bone]
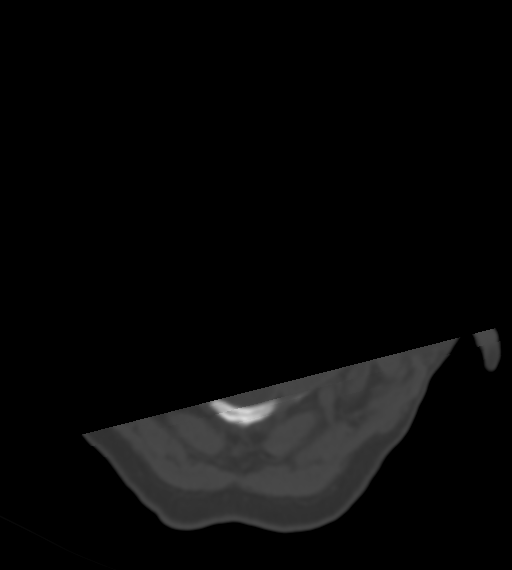
[im 12/54  brain]
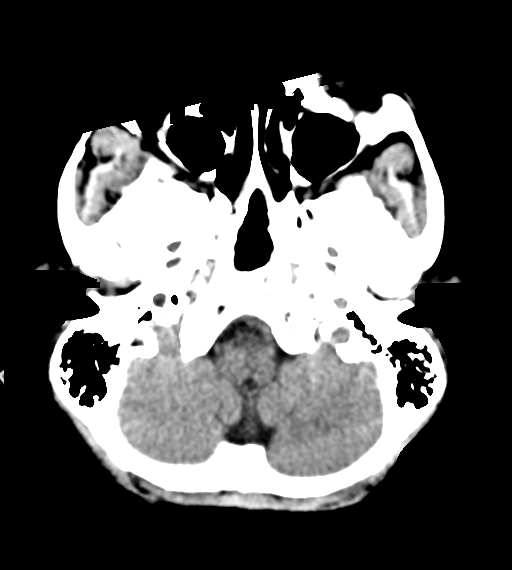
[im 19/54  brain]
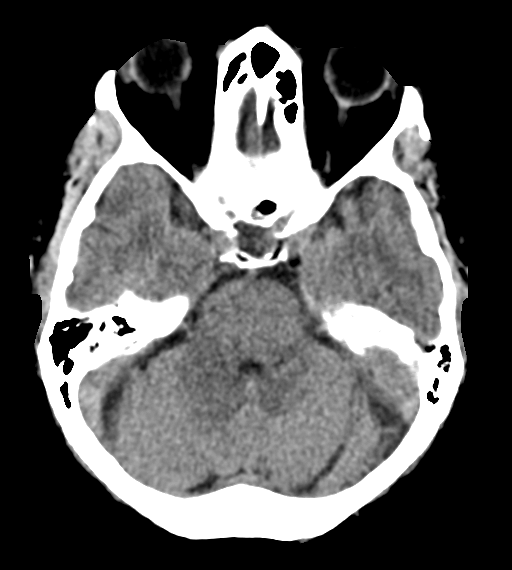
[im 23/54  brain]
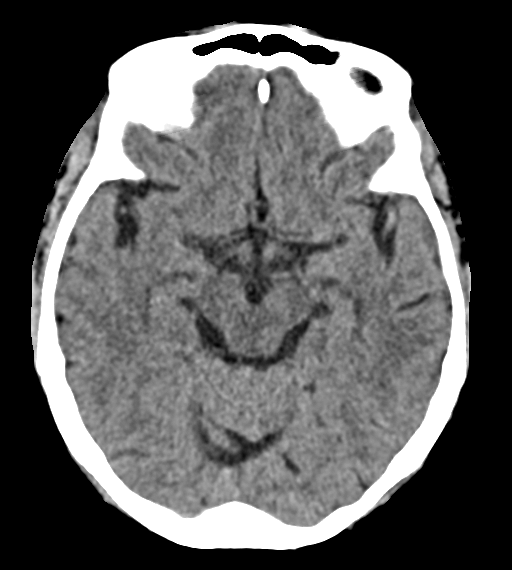
[im 31/54  brain]
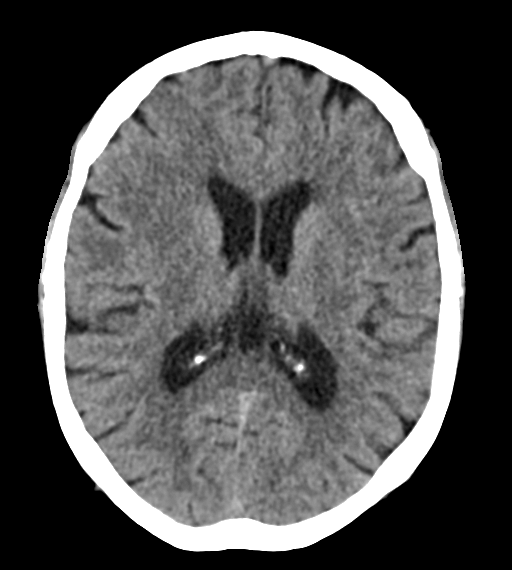
[im 31/54  bone]
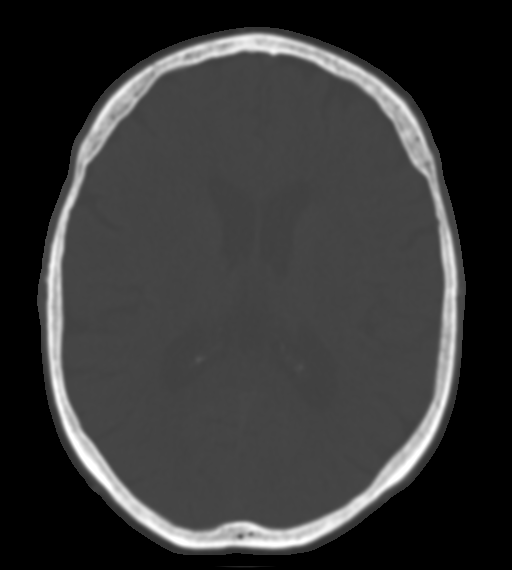
[im 35/54  brain]
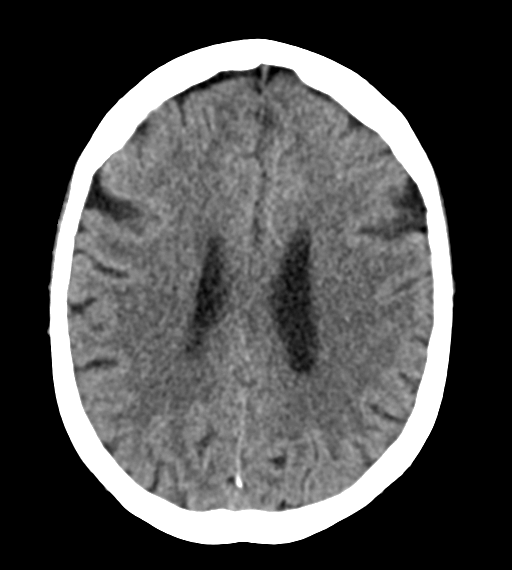
[im 42/54  brain]
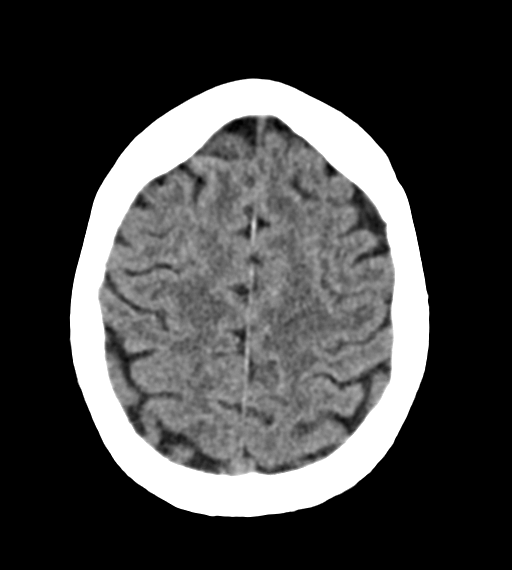
[im 50/54  brain]
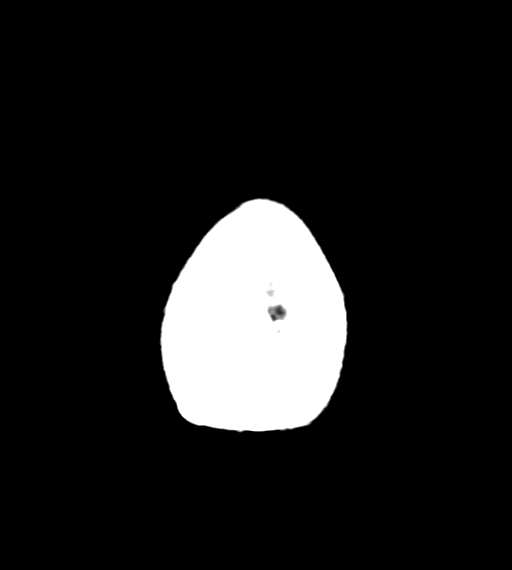

[Series 3: coronal · coronal · 0.32mm/px · 3 of 84 slices shown]
[im 28/84  brain]
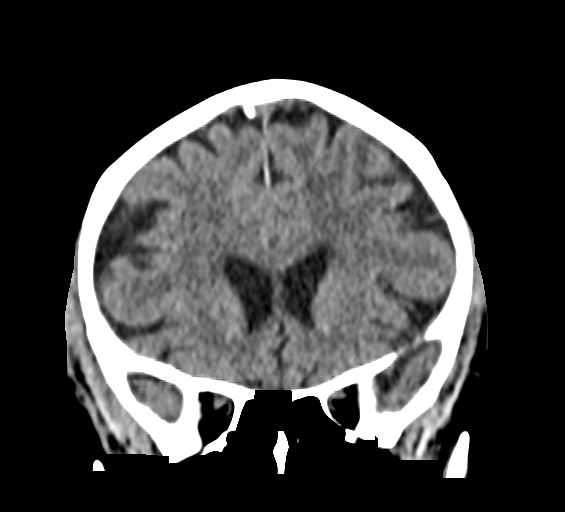
[im 37/84  brain]
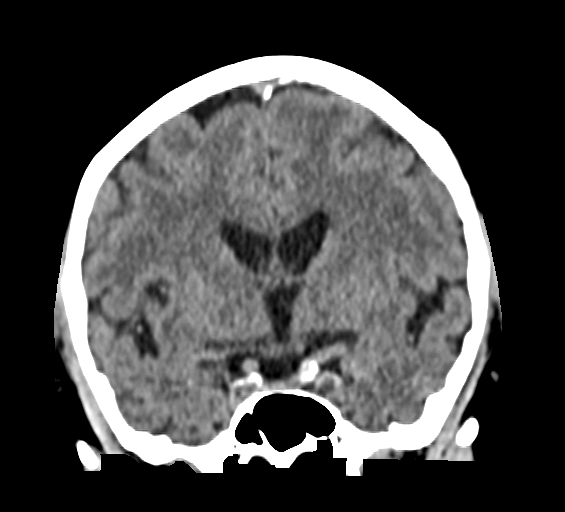
[im 47/84  brain]
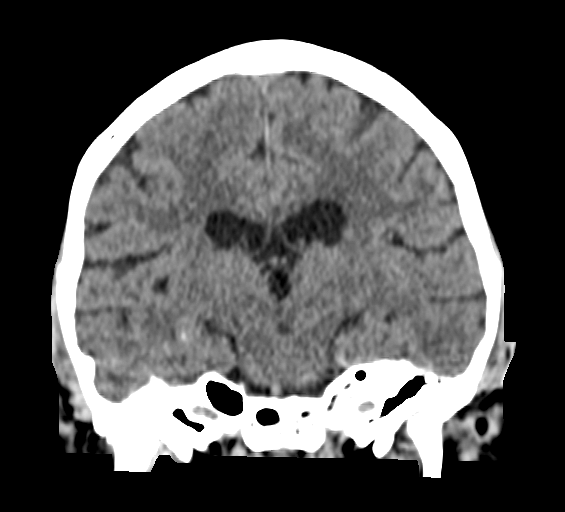

[Series 5: sag · sagittal · 0.32mm/px · 3 of 55 slices shown]
[im 19/55  brain]
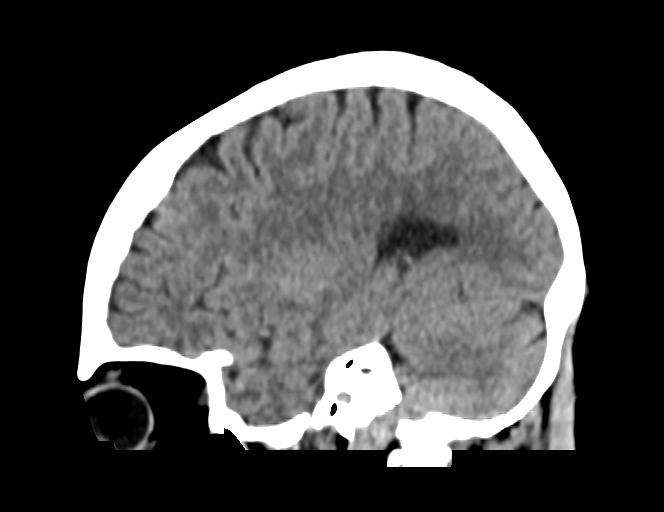
[im 28/55  brain]
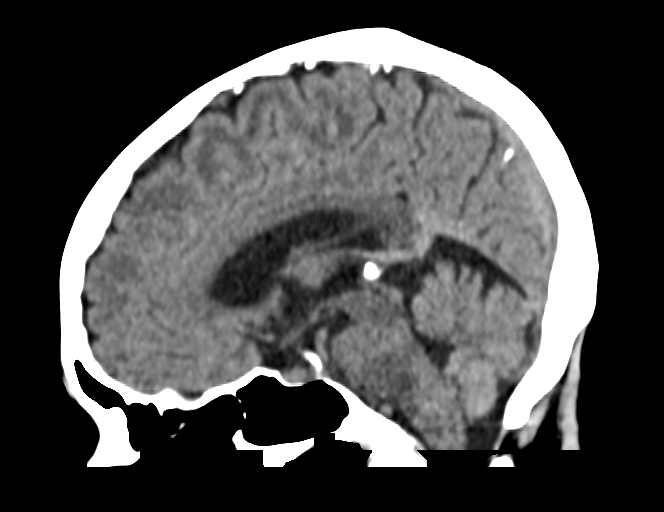
[im 37/55  brain]
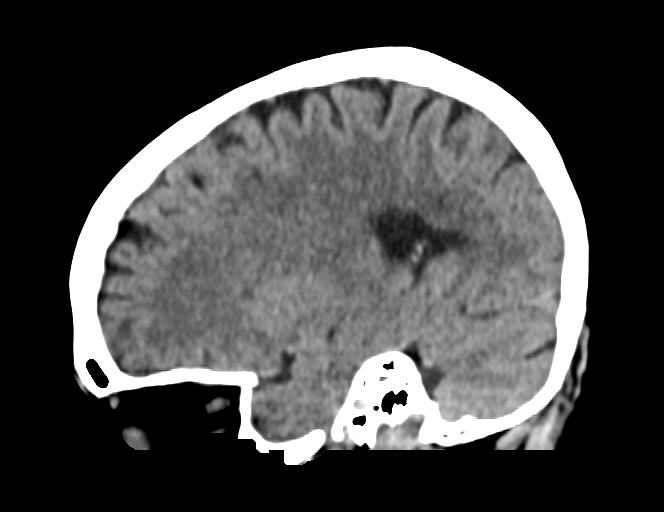

[14 of 47 positions shown; findings below may reference images not displayed]

FINDINGS: Intracranial contents: No evidence of acute infarct. No mass, mass effect, 
midline shift, or abnormal extra-axial fluid collection. Mild generalized brain 
volume loss without lobar specific pattern. No intracranial hemorrhage of any 
type. No hydrocephalus. Mild nonspecific periventricular low attenuation likely 
reflects chronic small vessel vascular change. 
Orbits, sinuses, and temporal bones: Marked no acute orbital pathology. Sinuses 
are well pneumatized. No mastoid or middle ear effusion. 
Bones and soft tissues: No soft tissue swelling. No fracture.
IMPRESSION: No acute intracranial process. Chronic appearing parenchymal changes as detailed 
above. 
RADIATION DOSE REDUCTION: All CT scans are performed using radiation dose 
reduction techniques, when applicable.  Technical factors are evaluated and 
adjusted to ensure appropriate moderation of exposure.  Automated dose 
management technology is applied to adjust the radiation doses to minimize 
exposure while achieving diagnostic quality images.

## 2023-05-07 IMAGING — MG MAMMOGRAPHY SCREENING BILATERAL 3[PERSON_NAME]
8 series · 8 of 24 positions shown · non-contrast
Comparison: Delay in the report was to obtain prior exams for comparison. They 
remain unavailable for comparison at the time of this dictation.

________________________________________________________________________________________________ 
MAMMOGRAPHY SCREENING BILATERAL 3KOUISSI RAJLI, 05/07/2023 [DATE]: 
CLINICAL INDICATION: Encounter for screening mammogram.
TECHNIQUE: Digital bilateral mammograms and 3-D Tomosynthesis were obtained. 
These were interpreted both primarily and with the aid of computer-aided 
detection system.  
BREAST DENSITY: (Level B) There are scattered areas of fibroglandular density.

[L MLO]
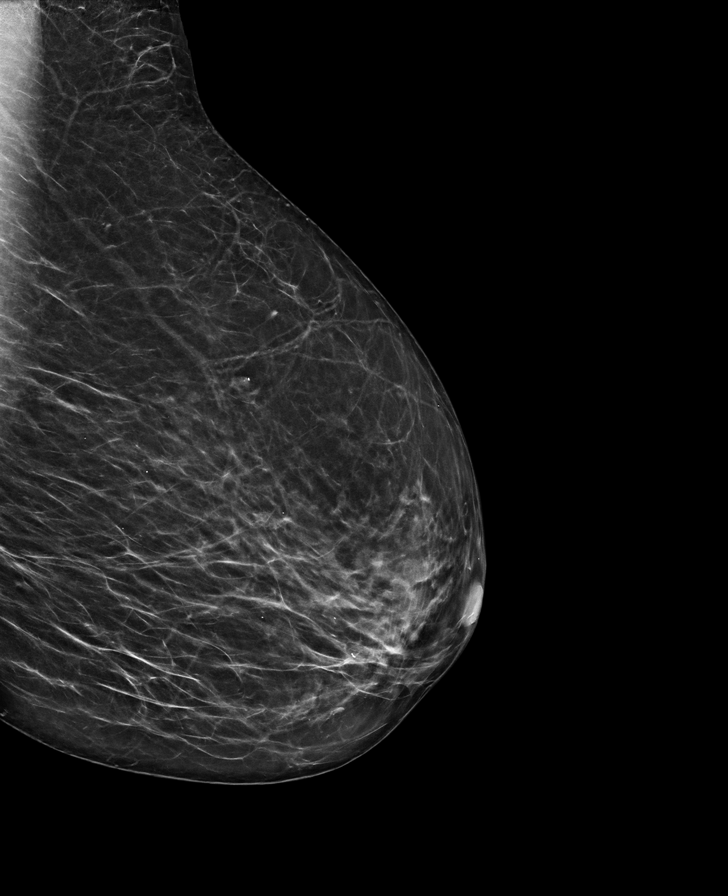

[R MLO]
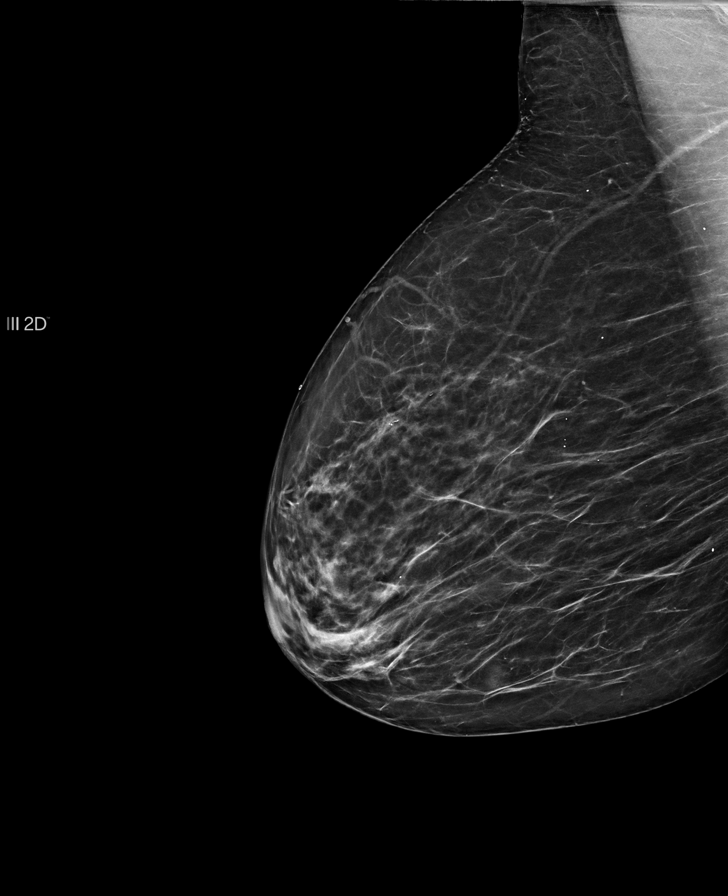

[R CC]
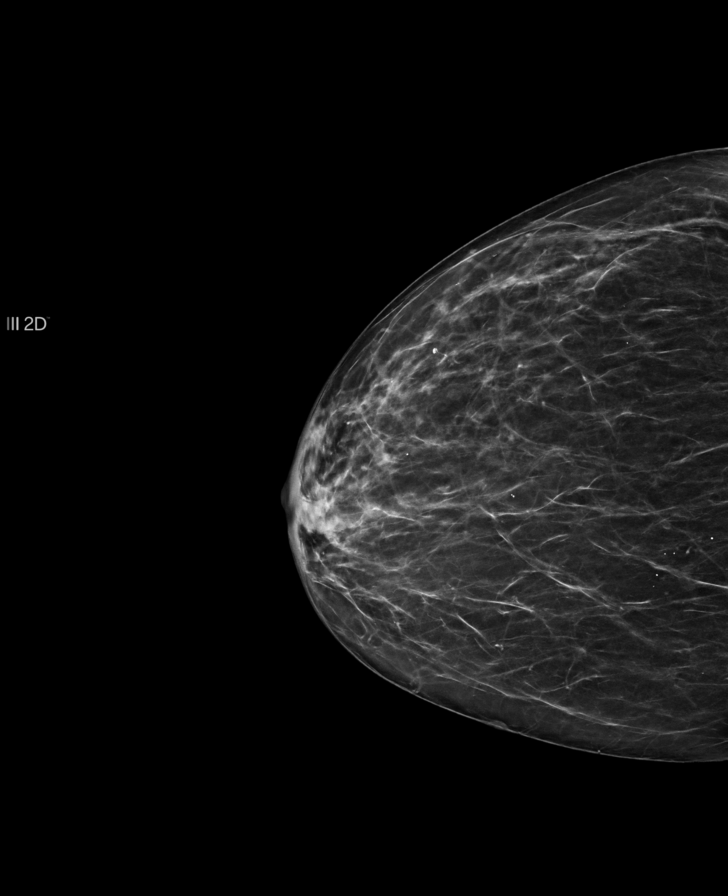

[L CC]
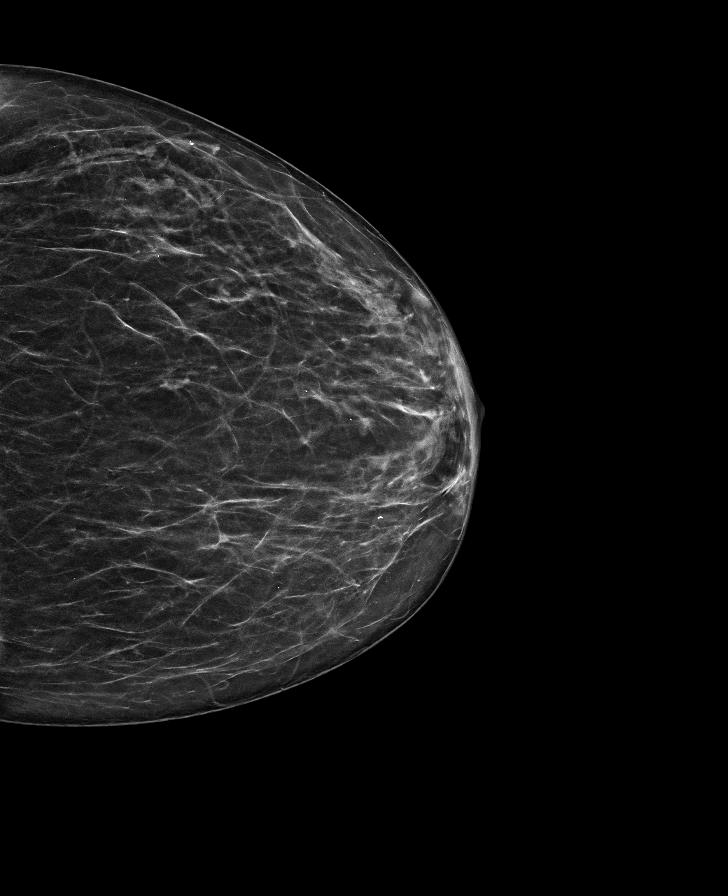

[R MLO tomo · tomo slice 31/62.0]
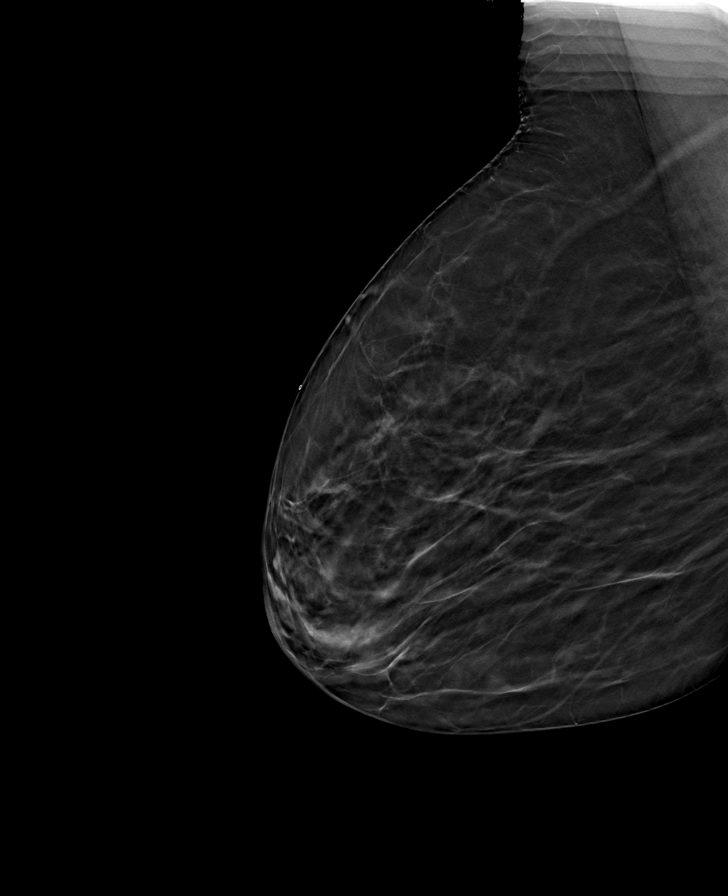

[L MLO tomo · tomo slice 31/62.0]
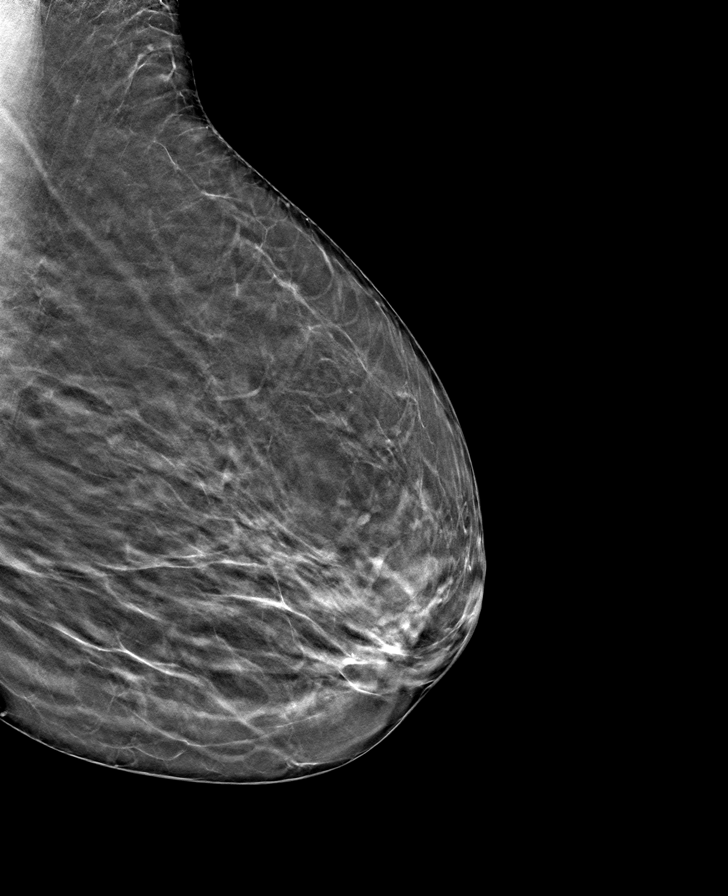

[R CC tomo · tomo slice 29/56.0]
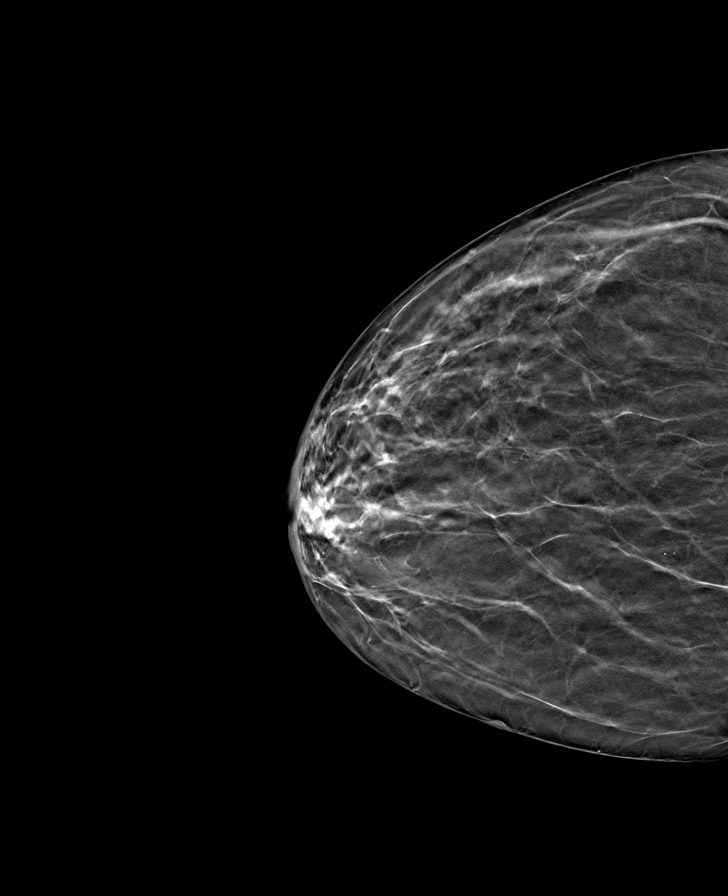

[L CC tomo · tomo slice 30/59.0]
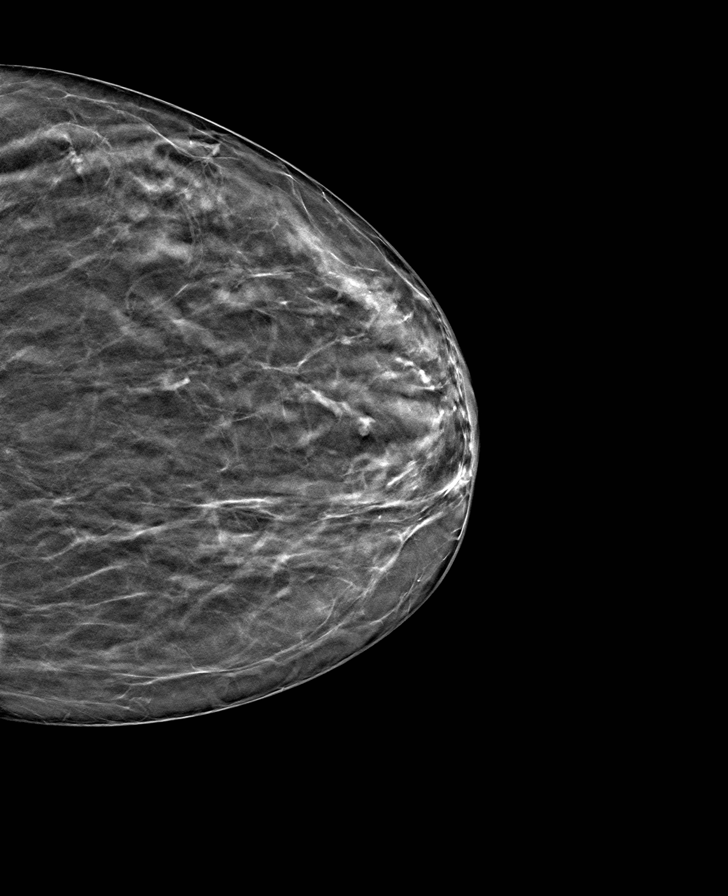

[8 of 24 positions shown; findings below may reference images not displayed]

FINDINGS: No suspicious mass, calcifications, or area of architectural 
distortion in either breast.
IMPRESSION: No mammographic findings suggestive for malignancy. 
(BI-RADS 2) Benign findings. Routine mammographic follow-up is recommended.

## 2023-06-05 IMAGING — MR MRI CERVICAL SPINE WITHOUT CONTRAST
7 of 10 series · 12 of 48 positions shown · IV contrast (gadolinium)
Comparison: Head CT December 06, 2021. Cervical spine x-ray September 26, 2020. CT 
neck February 22, 2020.

________________________________________________________________________________________________ 
MRI CERVICAL SPINE WITHOUT CONTRAST, 06/05/2023 [DATE]: 
CLINICAL INDICATION: Cervicalgia , chronic right-sided neck pain.
TECHNIQUE: Multiplanar, multiecho position MR images of the cervical spine were 
performed without intravenous gadolinium enhancement. Patient was scanned on a 
1.5T magnet.

[Series 101: survey · axial · 10.0mm · 1.25mm/px · 1 of 10 slices shown]
[im 1/10]
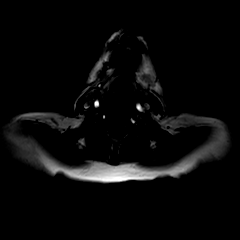

[Series 201: t2w_cor-surv · coronal · 5.0mm · 0.69mm/px · 1 of 7 slices shown]
[im 1/7]
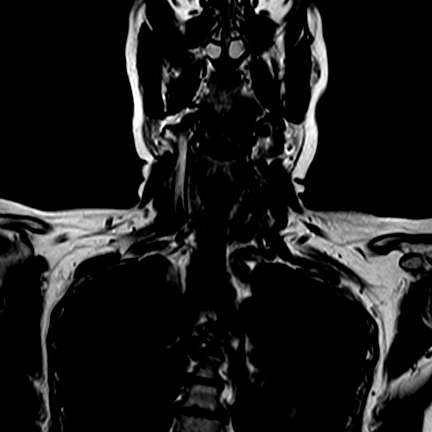

[Series 301: T1 · sagittal · 3.0mm · 0.35mm/px · 1 of 15 slices shown]
[im 1/15]
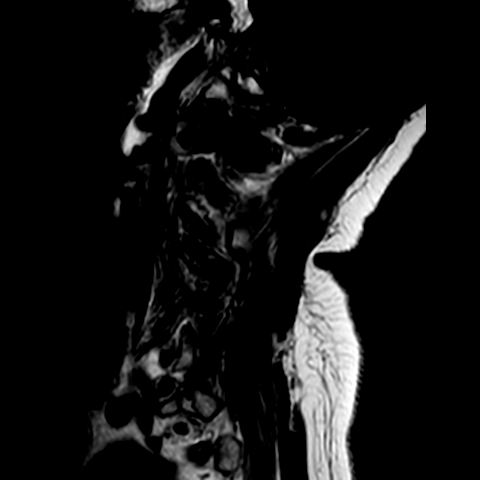

[Series 402: (id)_mdixon_tse · sagittal · 3.0mm · 0.39mm/px · 1 of 15 slices shown]
[im 1/15]
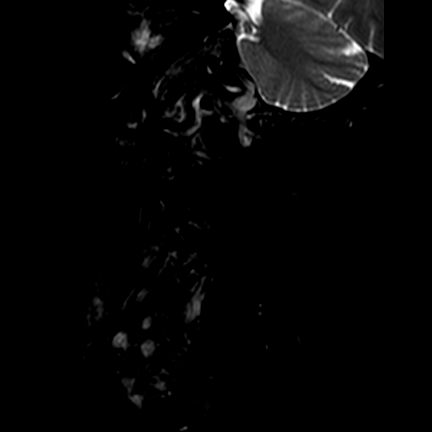

[Series 403: st2w_mdixon_tse · sagittal · 3.0mm · 0.39mm/px · 2 of 15 slices shown]
[im 1/15]
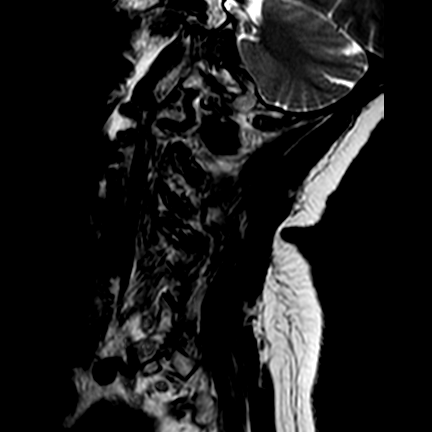
[im 15/15]
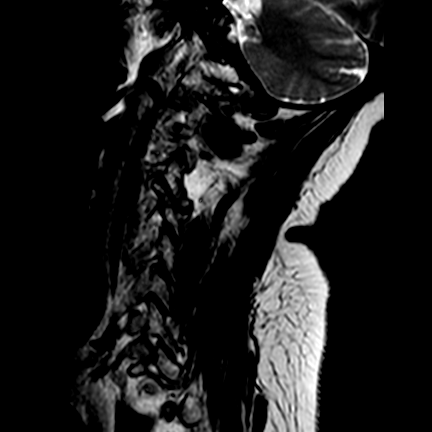

[Series 601: T2 · axial · 3.0mm · 0.29mm/px · z∈[-68,+14]mm · 2 of 18 slices shown]
[im 1/18]
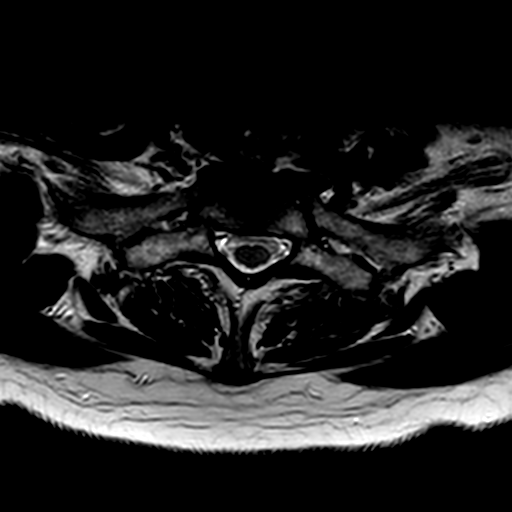
[im 18/18]
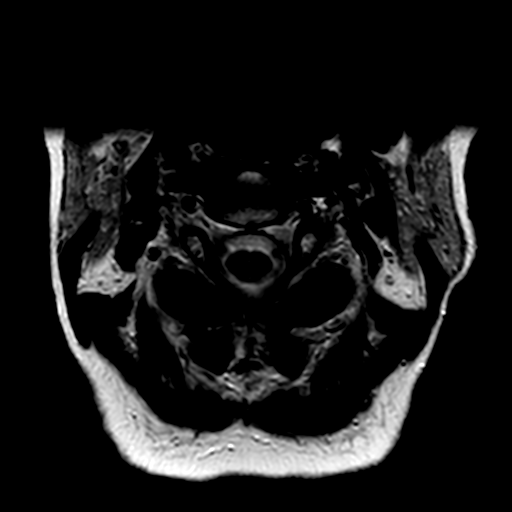

[Series 702: (person_name)_(person_name)_stack · axial · 3.0mm · 0.45mm/px · z∈[-68,+28]mm · 4 of 34 slices shown]
[im 1/34]
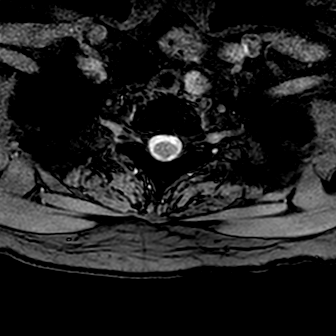
[im 12/34]
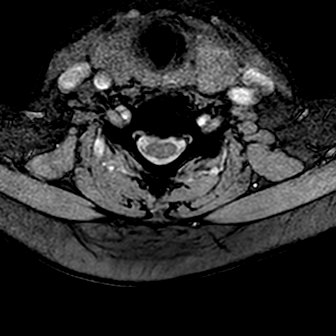
[im 23/34]
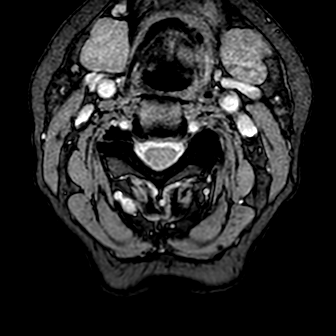
[im 34/34]
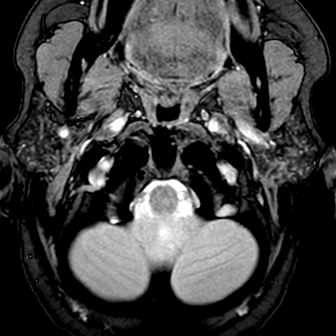

[12 of 48 positions shown; findings below may reference images not displayed]

FINDINGS: -------------------------------------------------------------------------------- 
----------------- 
GENERAL: 
ALIGNMENT: Minimal levoconvex upper thoracic scoliosis. Loss of the normal 
cervical lordosis with 2 mm anterolisthesis C3 on C4 and C4 on C5. 2 mm 
retrolisthesis C5 on C6 and C6 on C7. 
VERTEBRAL BODY HEIGHT: Normal.  
MARROW SIGNAL: No focal suspect signal abnormality. 
CORD SIGNAL: Normal.  
ADDITIONAL FINDINGS: Heterogeneously enlarged left thyroid lobe with findings 
suggestive of a heterogeneous nodule measuring 3.3 x 2.2 cm. This deviates the 
airway from the left to the right. There is some cystic change in the posterior 
aspect of the right thyroid lobe. Prominent submandibular ducts bilaterally. 
-------------------------------------------------------------------------------- 
---------------- 
SEGMENTAL: 
CRANIOCERVICAL JUNCTION: No significant stenosis. 
C2-C3: Ligamentum flavum hypertrophy. Normal disc height. Small central disc 
osteophyte protrusion. Canal and right foramen are patent. Mild left foraminal 
narrowing with left-sided facet arthropathy. 
C3-C4: Mild loss of disc height posteriorly. Disc uncovering. Ligamentum flavum 
hypertrophy. Slight ventral cord flattening with mild canal stenosis. Bilateral 
facet arthropathy. The foramina are mildly narrowed bilaterally. 
C4-C5: Normal disc height. Disc uncovering. Ventral cord flattening with mild 
canal stenosis. Right foramen patent. Left-sided uncinate spurring with 
moderately severe left foraminal narrowing. Left-sided facet arthropathy. 
C5-C6: Severe loss of disc height posteriorly. Ligamentum flavum hypertrophy. 
Disc osteophyte complex flattens the ventral cord margin with mild canal 
stenosis. Bilateral uncinate spurring with moderately severe bilateral foramina 
and. Normal facets. 
C6-C7: Moderate loss of disc height. Ligamentum flavum hypertrophy. Disc 
osteophyte complex abuts and flattens the ventral cord margin with mild canal 
stenosis. Left-sided uncinate spurring. Moderately severe bilateral foraminal 
narrowing. Normal facets. 
C7-T1: Normal disc height. No herniation. Normal facets. No spinal canal or 
neural foraminal stenosis. Left-sided uncinate spurring. 
T1-T2: Loss of disc height. Minimal generalized annular bulge. Canal and 
foramina are patent. 
-------------------------------------------------------------------------------- 
---------------
IMPRESSION: Multilevel cervical spondylosis. While there are only mild degrees of canal 
stenosis, there is multilevel ventral cord distortion and significant multilevel 
foraminal narrowing detailed above. 
Enlargement of the left thyroid lobe is felt to be reflective of a dominant 
heterogeneous nodule. Cystic changes noted in the posterior aspect of the right 
thyroid lobe. Follow-up thyroid ultrasound recommended.
